# Patient Record
Sex: Male | Born: 1985
Health system: Southern US, Community
[De-identification: ages and names within clinical notes are randomized; demographics above are authoritative.]

## PROBLEM LIST (undated history)

## (undated) DIAGNOSIS — E785 Hyperlipidemia, unspecified: Secondary | ICD-10-CM

## (undated) DIAGNOSIS — I219 Acute myocardial infarction, unspecified: Secondary | ICD-10-CM

## (undated) DIAGNOSIS — I10 Essential (primary) hypertension: Secondary | ICD-10-CM

## (undated) DIAGNOSIS — N049 Nephrotic syndrome with unspecified morphologic changes: Secondary | ICD-10-CM

## (undated) HISTORY — PX: SKIN GRAFT: SHX250

---

## 2010-08-04 ENCOUNTER — Emergency Department (HOSPITAL_COMMUNITY): Admission: EM | Admit: 2010-08-04 | Discharge: 2010-08-04 | Payer: Self-pay | Admitting: Emergency Medicine

## 2010-12-09 ENCOUNTER — Emergency Department (HOSPITAL_COMMUNITY)
Admission: EM | Admit: 2010-12-09 | Discharge: 2010-12-09 | Disposition: A | Discharge status: Home / Self Care | Payer: Self-pay | Attending: Emergency Medicine | Admitting: Emergency Medicine

## 2010-12-09 ENCOUNTER — Emergency Department (HOSPITAL_COMMUNITY): Payer: Self-pay

## 2010-12-09 DIAGNOSIS — M79609 Pain in unspecified limb: Secondary | ICD-10-CM | POA: Insufficient documentation

## 2010-12-09 DIAGNOSIS — L02519 Cutaneous abscess of unspecified hand: Secondary | ICD-10-CM | POA: Insufficient documentation

## 2010-12-09 DIAGNOSIS — L03019 Cellulitis of unspecified finger: Secondary | ICD-10-CM | POA: Insufficient documentation

## 2010-12-09 DIAGNOSIS — R229 Localized swelling, mass and lump, unspecified: Secondary | ICD-10-CM | POA: Insufficient documentation

## 2010-12-09 LAB — URINALYSIS, ROUTINE W REFLEX MICROSCOPIC
Bilirubin Urine: NEGATIVE
Glucose, UA: NEGATIVE mg/dL
Hgb urine dipstick: NEGATIVE
Ketones, ur: NEGATIVE mg/dL
pH: 7 (ref 5.0–8.0)

## 2011-05-28 ENCOUNTER — Emergency Department (HOSPITAL_COMMUNITY)
Admission: EM | Admit: 2011-05-28 | Discharge: 2011-05-28 | Disposition: A | Discharge status: Home / Self Care | Payer: Self-pay | Attending: Emergency Medicine | Admitting: Emergency Medicine

## 2011-05-28 DIAGNOSIS — M7989 Other specified soft tissue disorders: Secondary | ICD-10-CM | POA: Insufficient documentation

## 2011-05-28 DIAGNOSIS — L259 Unspecified contact dermatitis, unspecified cause: Secondary | ICD-10-CM | POA: Insufficient documentation

## 2011-05-28 DIAGNOSIS — M79609 Pain in unspecified limb: Secondary | ICD-10-CM | POA: Insufficient documentation

## 2012-01-08 ENCOUNTER — Encounter (HOSPITAL_COMMUNITY): Payer: Self-pay | Admitting: *Deleted

## 2012-01-08 ENCOUNTER — Emergency Department (HOSPITAL_COMMUNITY)
Admission: EM | Admit: 2012-01-08 | Discharge: 2012-01-08 | Disposition: A | Discharge status: Home / Self Care | Payer: Self-pay | Attending: Emergency Medicine | Admitting: Emergency Medicine

## 2012-01-08 DIAGNOSIS — H9209 Otalgia, unspecified ear: Secondary | ICD-10-CM | POA: Insufficient documentation

## 2012-01-08 DIAGNOSIS — H9201 Otalgia, right ear: Secondary | ICD-10-CM

## 2012-01-08 MED ORDER — AMOXICILLIN 500 MG PO CAPS: 500.0000 mg | ORAL_CAPSULE | Freq: Three times a day (TID) | ORAL | Status: AC

## 2012-01-08 MED ORDER — TRAMADOL HCL 50 MG PO TABS: 50.0000 mg | ORAL_TABLET | Freq: Four times a day (QID) | ORAL | Status: AC | PRN

## 2012-01-08 MED ORDER — IBUPROFEN 800 MG PO TABS: 800.0000 mg | ORAL_TABLET | Freq: Three times a day (TID) | ORAL | Status: AC

## 2012-01-08 NOTE — ED Provider Notes (Signed)
Medical screening examination/treatment/procedure(s) were performed by non-physician practitioner and as supervising physician I was immediately available for consultation/collaboration.  Gregoria Selvy, MD 01/08/12 1556 

## 2012-01-08 NOTE — ED Notes (Signed)
Pt reports right ear pain x 2 days associated with sore throat and right sided facial pain.

## 2012-01-08 NOTE — ED Provider Notes (Signed)
History     CSN: [id number]  Arrival date & time 01/08/12  0801   First MD Initiated Contact with Patient 01/08/12 [phone]      Chief Complaint  Patient presents with  . Otalgia    (Consider location/radiation/quality/duration/timing/severity/associated sxs/prior treatment) HPI History provided by pt.   Pt has had severe, constant, right inner ear pain w/ radiation into right jaw for the past two days.  Aggravated by opening and closing mouth.  Associated w/ mild hearing impairment, nasal congestion and mild cough that started last night.  Pt has no known h/o OM or TMJ.  Denies dental pain.  Denies trauma.    History reviewed. No pertinent past medical history.  History reviewed. No pertinent past surgical history.  History reviewed. No pertinent family history.  History  Substance Use Topics  . Smoking status: Never Smoker   . Smokeless tobacco: Not on file  . Alcohol Use: Yes     occ      Review of Systems  All other systems reviewed and are negative.    Allergies  Review of patient's allergies indicates no known allergies.  Home Medications   Current Outpatient Rx  Name Route Sig Dispense Refill  . IBUPROFEN 800 MG PO TABS Oral Take 1 tablet (800 mg total) by mouth 3 (three) times daily. 12 tablet 0  . TRAMADOL HCL 50 MG PO TABS Oral Take 1 tablet (50 mg total) by mouth every 6 (six) hours as needed for pain. 12 tablet 0    BP 146/81  Pulse 71  Temp(Src) 98.6 F (37 C) (Oral)  Resp 16  SpO2 99%  Physical Exam  Nursing note and vitals reviewed. Constitutional: He is oriented to person, place, and time. He appears well-developed and well-nourished. No distress.  HENT:  Head: Normocephalic and atraumatic. No trismus in the jaw.  Right Ear: Tympanic membrane, external ear and ear canal normal.  Left Ear: Tympanic membrane, external ear and ear canal normal.  Mouth/Throat: Uvula is midline and mucous membranes are normal. No oropharyngeal exudate, posterior  oropharyngeal edema or posterior oropharyngeal erythema.       Left TM nml.  Right TM w/ scarring, poor light reflex and what appears to be purulent fluid behind it.  Pain w/ palpation of tragus.  No tenderness of eustachian tube, mastoid, TMJ or mandible.  No popping or pain w/ opening jaw.  All teeth non-tender and gingiva/buccal mucosa and posterior pharynx appear normal.   Eyes:       Normal appearance  Neck: Normal range of motion. Neck supple.  Cardiovascular: Normal rate and regular rhythm.   Pulmonary/Chest: Effort normal and breath sounds normal.  Lymphadenopathy:    He has no cervical adenopathy.  Neurological: He is alert and oriented to person, place, and time.  Skin: Skin is warm and dry. No rash noted.  Psychiatric: He has a normal mood and affect. His behavior is normal.    ED Course  Procedures (including critical care time)  Labs Reviewed - No data to display No results found.   1. Otalgia of right ear       MDM  Pt presents w/ right ear pain x 2 days.  Exam most consistent w/ OM.  Pt prescribed amoxicillin for delayed abx therapy; discussed w/ him extensively. D/c'd home w/ ibuprofen and tramadol for pain.  Referred to [facility].         Arie Sabina Claira Jeter, [place] 01/08/12 1230

## 2012-01-08 NOTE — Discharge Instructions (Signed)
Your ear pain is most likely related to a viral upper respiratory tract infection.  Otalgia The most common reason for this in children is an infection of the middle ear. Pain from the middle ear is usually caused by a build-up of fluid and pressure behind the eardrum. Pain from an earache can be sharp, dull, or burning. The pain may be temporary or constant. The middle ear is connected to the nasal passages by a short narrow tube called the Eustachian tube. The Eustachian tube allows fluid to drain out of the middle ear, and helps keep the pressure in your ear equalized. CAUSES  A cold or allergy can block the Eustachian tube with inflammation and the build-up of secretions. This is especially likely in small children, because their Eustachian tube is shorter and more horizontal. When the Eustachian tube closes, the normal flow of fluid from the middle ear is stopped. Fluid can accumulate and cause stuffiness, pain, hearing loss, and an ear infection if germs start growing in this area. SYMPTOMS  The symptoms of an ear infection may include fever, ear pain, fussiness, increased crying, and irritability. Many children will have temporary and minor hearing loss during and right after an ear infection. Permanent hearing loss is rare, but the risk increases the more infections a child has. Other causes of ear pain include retained water in the outer ear canal from swimming and bathing. Ear pain in adults is less likely to be from an ear infection. Ear pain may be referred from other locations. Referred pain may be from the joint between your jaw and the skull. It may also come from a tooth problem or problems in the neck. Other causes of ear pain include:  A foreign body in the ear.   Outer ear infection.   Sinus infections.   Impacted ear wax.   Ear injury.   Arthritis of the jaw or TMJ problems.   Middle ear infection.   Tooth infections.   Sore throat with pain to the ears.  DIAGNOSIS  Your  caregiver can usually make the diagnosis by examining you. Sometimes other special studies, including x-rays and lab work may be necessary. TREATMENT   If antibiotics were prescribed, use them as directed and finish them even if you or your child's symptoms seem to be improved.   Sometimes PE tubes are needed in children. These are little plastic tubes which are put into the eardrum during a simple surgical procedure. They allow fluid to drain easier and allow the pressure in the middle ear to equalize. This helps relieve the ear pain caused by pressure changes.  HOME CARE INSTRUCTIONS   Only take over-the-counter or prescription medicines for pain, discomfort, or fever as directed by your caregiver. DO NOT GIVE CHILDREN ASPIRIN because of the association of Reye's Syndrome in children taking aspirin.   Use a cold pack applied to the outer ear for 15 to 20 minutes, 3 to 4 times per day or as needed may reduce pain. Do not apply ice directly to the skin. You may cause frost bite.   Over-the-counter ear drops used as directed may be effective. Your caregiver may sometimes prescribe ear drops.   Resting in an upright position may help reduce pressure in the middle ear and relieve pain.   Ear pain caused by rapidly descending from high altitudes can be relieved by swallowing or chewing gum. Allowing infants to suck on a bottle during airplane travel can help.   Do not smoke in  the house or near children. If you are unable to quit smoking, smoke outside.   Control allergies.  SEEK IMMEDIATE MEDICAL CARE IF:   You or your child are becoming sicker.   Pain or fever relief is not obtained with medicine.   You or your child's symptoms (pain, fever, or irritability) do not improve within 24 to 48 hours or as instructed.   Severe pain suddenly stops hurting. This may indicate a ruptured eardrum.   You or your children develop new problems such as severe headaches, stiff neck, difficulty  swallowing, or swelling of the face or around the ear.  Document Released: 05/01/2004 Document Revised: 09/03/2011 Document Reviewed: 09/05/2008 [facility] Patient Information 2012 [place], [place].Hold onto antibiotic until Sunday morning.  If your pain has not started to improve or worsens by that time, take medication as prescribed.  Take ibuprofen w/ food up to three times a day, as needed for pain.  Take ultram if pain severe.  Do not drive within four hours of taking this medication (may cause drowsiness or confusion).  Call [facility] [phone]) if you do not have a primary care doctor and would like assistance with finding one.    You may return to the ER if symptoms worsen or you have any other concerns.

## 2012-05-18 ENCOUNTER — Encounter (HOSPITAL_COMMUNITY): Payer: Self-pay | Admitting: *Deleted

## 2012-05-18 ENCOUNTER — Emergency Department (HOSPITAL_COMMUNITY)
Admission: EM | Admit: 2012-05-18 | Discharge: 2012-05-19 | Disposition: A | Discharge status: Home / Self Care | Payer: 59 | Attending: Emergency Medicine | Admitting: Emergency Medicine

## 2012-05-18 DIAGNOSIS — S61219A Laceration without foreign body of unspecified finger without damage to nail, initial encounter: Secondary | ICD-10-CM

## 2012-05-18 DIAGNOSIS — S61209A Unspecified open wound of unspecified finger without damage to nail, initial encounter: Secondary | ICD-10-CM | POA: Insufficient documentation

## 2012-05-18 DIAGNOSIS — W278XXA Contact with other nonpowered hand tool, initial encounter: Secondary | ICD-10-CM | POA: Insufficient documentation

## 2012-05-18 NOTE — ED Notes (Signed)
Patient with laceration to right middle finger with grinder on Tuesday morning, patient with bandage on finger at this time, no bleeding at this time

## 2012-05-19 NOTE — ED Provider Notes (Signed)
History     CSN: [id number]  Arrival date & time 05/18/12  2243   First MD Initiated Contact with Patient 05/19/12 0002      Chief Complaint  Patient presents with  . Laceration    (Consider location/radiation/quality/duration/timing/severity/associated sxs/prior treatment) Patient is a 26 y.o. male presenting with skin laceration. The history is provided by the patient.  Laceration  The incident occurred yesterday. Pain location: right middle finger. The laceration is 2 cm in size. Injury mechanism: it was cut by a grinder. The pain is at a severity of 2/10. The pain is moderate. The pain has been constant since onset. He reports no foreign bodies present. His tetanus status is UTD.    History reviewed. No pertinent past medical history.  History reviewed. No pertinent past surgical history.  No family history on file.  History  Substance Use Topics  . Smoking status: Never Smoker   . Smokeless tobacco: Not on file  . Alcohol Use: No     occ      Review of Systems  All other systems reviewed and are negative.    Allergies  Review of patient's allergies indicates no known allergies.  Home Medications  No current outpatient prescriptions on file.  BP 131/92  Pulse 66  Temp 97.6 F (36.4 C) (Oral)  Resp 18  SpO2 96%  Physical Exam  Nursing note and vitals reviewed. Constitutional: He is oriented to person, place, and time. He appears well-developed and well-nourished. No distress.  HENT:  Head: Normocephalic and atraumatic.  Eyes: EOM are normal. Pupils are equal, round, and reactive to light.  Musculoskeletal:       Hands: Neurological: He is alert and oriented to person, place, and time. He has normal strength. No sensory deficit.  Skin: Skin is warm and dry.    ED Course  Procedures (including critical care time)  Labs Reviewed - No data to display No results found.   No diagnosis found.    MDM   Patient with a laceration to his right  middle finger over 24 hours ago which he states continues to bleed. On exam the wound is no longer bleeding normal appearing and not no signs of infection. He has fair range of motion of the finger and normal capillary refill. Patient given bacitracin. His tetanus shot is up-to-date.        Gwyneth Sprout, MD 05/19/12 [phone]

## 2012-10-22 ENCOUNTER — Encounter (HOSPITAL_COMMUNITY): Payer: Self-pay | Admitting: Emergency Medicine

## 2012-10-22 ENCOUNTER — Emergency Department (HOSPITAL_COMMUNITY)
Admission: EM | Admit: 2012-10-22 | Discharge: 2012-10-22 | Disposition: A | Discharge status: Home / Self Care | Payer: 59 | Attending: Emergency Medicine | Admitting: Emergency Medicine

## 2012-10-22 DIAGNOSIS — Y9389 Activity, other specified: Secondary | ICD-10-CM | POA: Insufficient documentation

## 2012-10-22 DIAGNOSIS — Y9289 Other specified places as the place of occurrence of the external cause: Secondary | ICD-10-CM | POA: Insufficient documentation

## 2012-10-22 DIAGNOSIS — M5380 Other specified dorsopathies, site unspecified: Secondary | ICD-10-CM | POA: Insufficient documentation

## 2012-10-22 DIAGNOSIS — X500XXA Overexertion from strenuous movement or load, initial encounter: Secondary | ICD-10-CM | POA: Insufficient documentation

## 2012-10-22 DIAGNOSIS — IMO0002 Reserved for concepts with insufficient information to code with codable children: Secondary | ICD-10-CM | POA: Insufficient documentation

## 2012-10-22 DIAGNOSIS — M549 Dorsalgia, unspecified: Secondary | ICD-10-CM

## 2012-10-22 DIAGNOSIS — M62838 Other muscle spasm: Secondary | ICD-10-CM

## 2012-10-22 DIAGNOSIS — Y990 Civilian activity done for income or pay: Secondary | ICD-10-CM | POA: Insufficient documentation

## 2012-10-22 MED ORDER — NAPROXEN 500 MG PO TABS: 500.0000 mg | ORAL_TABLET | Freq: Two times a day (BID) | ORAL | Status: DC

## 2012-10-22 MED ORDER — DIAZEPAM 5 MG PO TABS: 5.0000 mg | ORAL_TABLET | Freq: Three times a day (TID) | ORAL | Status: DC | PRN

## 2012-10-22 NOTE — ED Notes (Addendum)
Patient complaining of mid-back pain that started on Friday after he moved a heater; patient ambulatory in triage.  Able to move all extremities without difficulty.  Has not taken any OTC medications for pain.

## 2012-10-22 NOTE — ED Notes (Signed)
Pt in nad, pt c/o mid back pain that started yesterday after lifting and moving heavy tools at work. Pt has full movement and sensation of bilateral lower extremities. Pt ambulatory.

## 2012-10-22 NOTE — ED Provider Notes (Signed)
History   This chart was scribed for non-physician practitioner working with Derwood Kaplan, MD by Leone Payor, ED Scribe. This patient was seen in room TR10C/TR10C and the patient's care was started at 1928.   CSN: [id number]  Arrival date & time 10/22/12  1928   First MD Initiated Contact with Patient 10/22/12 1945      Chief Complaint  Patient presents with  . Back Pain     The history is provided by the patient. No language interpreter was used.    Justin Valdez is a 27 y.o. male who presents to the Emergency Department complaining of constant, unchanged, non-radiating mid back pain starting 1 day ago after lifting and moving heavy tools at work. Pt denies numbness, weakness, tingling, fever, night sweats, chills,    Pt denies smoking but occasionally uses alcohol.  History reviewed. No pertinent past medical history.  Past Surgical History  Procedure Date  . Skin graft     History reviewed. No pertinent family history.  History  Substance Use Topics  . Smoking status: Never Smoker   . Smokeless tobacco: Not on file  . Alcohol Use: Yes     Comment: Occassional Use      Review of Systems  A complete 10 system review of systems was obtained and all systems are negative except as noted in the HPI and PMH.    Allergies  Review of patient's allergies indicates no known allergies.  Home Medications  No current outpatient prescriptions on file.  BP 169/95  Temp 97.7 F (36.5 C) (Oral)  Resp 18  SpO2 97%  Physical Exam  Nursing note and vitals reviewed. Constitutional: He is oriented to person, place, and time. He appears well-developed and well-nourished. No distress.  HENT:  Head: Normocephalic and atraumatic.  Eyes: Conjunctivae normal and EOM are normal.  Neck: Normal range of motion. Neck supple.  Cardiovascular:       Intact distal pulses, capillary refill < 3 seconds.   Pulmonary/Chest: Effort normal.  Musculoskeletal: Normal range of motion.       All other extremities with normal ROM  Para thoracic tenderness. Muscular tenderness.  No spinal process tenderness. Pain with rotation, flexion, extension. Lower extremities with normal ROM.     Neurological: He is alert and oriented to person, place, and time.       No sensory deficit.   Strength 5/5 bilaterally in lower extremities, including thighs and plantar flexion. Proximal and distal sensation intact.    Skin: Skin is warm and dry. No rash noted. He is not diaphoretic.       Skin intact, no tenting  Psychiatric: He has a normal mood and affect. His behavior is normal.    ED Course  Procedures (including critical care time)  DIAGNOSTIC STUDIES: Oxygen Saturation is 97% on room air, adequate by my interpretation.    COORDINATION OF CARE:  8:09 PM Discussed treatment plan which includes Valium, use of heating pad with pt at bedside and pt agreed to plan. Pt advised not to lift heavy objects over 10 lbs and continued use of OTC pain medication. Pt will be given work note.   Labs Reviewed - No data to display No results found.   No diagnosis found.    MDM  Back pain  Patient with back pain.  No neurological deficits and normal neuro exam.  Patient can walk but states is painful.  No loss of bowel or bladder control.  No concern for cauda equina.  No fever, night sweats, weight loss, h/o cancer, IVDU.  RICE protocol and pain medicine indicated and discussed with patient.        I personally performed the services described in this documentation, which was scribed in my presence. The recorded information has been reviewed and is accurate.      Jaci Carrel, [place] 10/22/12 2344

## 2012-10-23 NOTE — ED Provider Notes (Signed)
Medical screening examination/treatment/procedure(s) were performed by non-physician practitioner and as supervising physician I was immediately available for consultation/collaboration.  Rickelle Sylvestre, MD 10/23/12 1600 

## 2013-08-28 ENCOUNTER — Emergency Department (HOSPITAL_COMMUNITY)
Admission: EM | Admit: 2013-08-28 | Discharge: 2013-08-28 | Disposition: A | Discharge status: Home / Self Care | Payer: 59 | Attending: Emergency Medicine | Admitting: Emergency Medicine

## 2013-08-28 ENCOUNTER — Encounter (HOSPITAL_COMMUNITY): Payer: Self-pay | Admitting: Emergency Medicine

## 2013-08-28 DIAGNOSIS — H9201 Otalgia, right ear: Secondary | ICD-10-CM

## 2013-08-28 DIAGNOSIS — H6691 Otitis media, unspecified, right ear: Secondary | ICD-10-CM

## 2013-08-28 DIAGNOSIS — H6690 Otitis media, unspecified, unspecified ear: Secondary | ICD-10-CM | POA: Insufficient documentation

## 2013-08-28 MED ORDER — ANTIPYRINE-BENZOCAINE 5.4-1.4 % OT SOLN
3.0000 [drp] | Freq: Once | OTIC | Status: AC
  Administered 2013-08-28: 4 [drp] via OTIC
  Filled 2013-08-28: qty 10

## 2013-08-28 MED ORDER — AMOXICILLIN 500 MG PO CAPS: 500.0000 mg | ORAL_CAPSULE | Freq: Three times a day (TID) | ORAL | Status: DC

## 2013-08-28 MED ORDER — IBUPROFEN 200 MG PO TABS
600.0000 mg | ORAL_TABLET | Freq: Once | ORAL | Status: AC
  Administered 2013-08-28: 600 mg via ORAL
  Filled 2013-08-28: qty 3

## 2013-08-28 NOTE — ED Notes (Signed)
Pt states he sneezed and felt like right ear popped.  Pt states pain radiate to the entire right side of face. Pain level at 10 at present. No meds taken prior to arrival.

## 2013-08-28 NOTE — ED Provider Notes (Signed)
CSN: [id number]     Arrival date & time 08/28/13  4098 History   First MD Initiated Contact with Patient 08/28/13 [phone]     Chief Complaint  Patient presents with  . Otalgia   (Consider location/radiation/quality/duration/timing/severity/associated sxs/prior Treatment) The history is provided by the patient.   patient reports worsening right ear pain over the past 24 hours.  No trauma to this area.  He denies fevers and chills.  No upper respiratory symptoms.  No history of ear infections.  No recent swimming.  His pain is mild to moderate in severity.  Nothing worsens or improves his pain  History reviewed. No pertinent past medical history. Past Surgical History  Procedure Laterality Date  . Skin graft     No family history on file. History  Substance Use Topics  . Smoking status: Never Smoker   . Smokeless tobacco: Not on file  . Alcohol Use: Yes     Comment: Occassional Use    Review of Systems  HENT: Positive for ear pain.   All other systems reviewed and are negative.    Allergies  Review of patient's allergies indicates no known allergies.  Home Medications   Current Outpatient Rx  Name  Route  Sig  Dispense  Refill  . amoxicillin (AMOXIL) 500 MG capsule   Oral   Take 1 capsule (500 mg total) by mouth 3 (three) times daily.   21 capsule   0    BP 166/76  Pulse 67  Temp(Src) 97.7 F (36.5 C) (Oral)  Resp 16  SpO2 97% Physical Exam  Nursing note and vitals reviewed. Constitutional: He is oriented to person, place, and time. He appears well-developed and well-nourished.  HENT:  Head: Normocephalic and atraumatic.  Right TM with erythema.  No swelling of his right external canal.  Left TM is normal in appearance.  No drainage from the right TM.  Posterior pharynx normal  Eyes: EOM are normal.  Neck: Normal range of motion.  Cardiovascular: Normal rate.   Pulmonary/Chest: Effort normal.  Abdominal: Soft.  Musculoskeletal: Normal range of motion.   Neurological: He is alert and oriented to person, place, and time.  Skin: Skin is warm and dry.  Psychiatric: He has a normal mood and affect. Judgment normal.    ED Course  Procedures (including critical care time) Labs Review Labs Reviewed - No data to display Imaging Review No results found.  EKG Interpretation   None       MDM   1. Otalgia of right ear   2. Otitis media, right        Lyanne Co, MD 08/28/13 [phone]

## 2013-08-28 NOTE — ED Notes (Signed)
Pt. reports right ear ache onset yesterday with no drainage .

## 2013-11-10 ENCOUNTER — Emergency Department (HOSPITAL_COMMUNITY): Payer: 59

## 2013-11-10 ENCOUNTER — Encounter (HOSPITAL_COMMUNITY): Payer: Self-pay | Admitting: Emergency Medicine

## 2013-11-10 ENCOUNTER — Emergency Department (HOSPITAL_COMMUNITY)
Admission: EM | Admit: 2013-11-10 | Discharge: 2013-11-10 | Disposition: A | Discharge status: Home / Self Care | Payer: 59 | Attending: Emergency Medicine | Admitting: Emergency Medicine

## 2013-11-10 DIAGNOSIS — S46912A Strain of unspecified muscle, fascia and tendon at shoulder and upper arm level, left arm, initial encounter: Secondary | ICD-10-CM

## 2013-11-10 DIAGNOSIS — X500XXA Overexertion from strenuous movement or load, initial encounter: Secondary | ICD-10-CM | POA: Insufficient documentation

## 2013-11-10 DIAGNOSIS — Y929 Unspecified place or not applicable: Secondary | ICD-10-CM | POA: Insufficient documentation

## 2013-11-10 DIAGNOSIS — Y9389 Activity, other specified: Secondary | ICD-10-CM | POA: Insufficient documentation

## 2013-11-10 DIAGNOSIS — IMO0002 Reserved for concepts with insufficient information to code with codable children: Secondary | ICD-10-CM | POA: Insufficient documentation

## 2013-11-10 MED ORDER — TRAMADOL HCL 50 MG PO TABS: 50.0000 mg | ORAL_TABLET | Freq: Four times a day (QID) | ORAL | Status: DC | PRN

## 2013-11-10 MED ORDER — IBUPROFEN 800 MG PO TABS
800.0000 mg | ORAL_TABLET | Freq: Once | ORAL | Status: AC
  Administered 2013-11-10: 800 mg via ORAL
  Filled 2013-11-10: qty 1

## 2013-11-10 MED ORDER — IBUPROFEN 800 MG PO TABS: 800.0000 mg | ORAL_TABLET | Freq: Three times a day (TID) | ORAL | Status: DC

## 2013-11-10 NOTE — ED Provider Notes (Signed)
Medical screening examination/treatment/procedure(s) were conducted as a shared visit with non-physician practitioner(s) or resident  and myself.  I personally evaluated the patient during the encounter and agree with the findings and plan unless otherwise indicated.    I have personally reviewed any xrays and/ or EKG's with the provider and I agree with interpretation.   Left shoulder pain since lifting weights, worse with movement, no fevers.  Exam anterior deltoid tenderness worse with internal rotation and flexion, neg empty can test, no instability.  No warmth or swelling. NV intact left distal arm. Fup with ortho.  Left shoulder pain  Justin SkeensJoshua M Mateen Franssen, MD 11/10/13 [phone]

## 2013-11-10 NOTE — ED Provider Notes (Signed)
CSN: [id number]     Arrival date & time 11/10/13  0814 History  This chart was scribed for non-physician practitioner Jaynie Crumbleatyana Rasean Joos, PA-C working with Enid SkeensJoshua M Zavitz, MD by Dorothey Basemania Sutton, ED Scribe. This patient was seen in room D36C/D36C and the patient's care was started at 9:23 AM.    Chief Complaint  Patient presents with  . Shoulder Pain   The history is provided by the patient. No language interpreter was used.   HPI Comments: Justin Valdez is a 28 y.o. male who presents to the Emergency Department complaining of a constant, sudden onset pain to the left shoulder onset 3 days ago after lifting weights (bench press). He states that the pain is exacerbated with movement. Patient reports applying heat and ice to the area with mild, temporary relief. He denies taking medications at home to treat his symptoms. Patient denies history of prior injury to the area. Patient has no other pertinent medical history. He states that he does not currently have a PCP.  History reviewed. No pertinent past medical history. Past Surgical History  Procedure Laterality Date  . Skin graft    . Skin graft     No family history on file. History  Substance Use Topics  . Smoking status: Never Smoker   . Smokeless tobacco: Not on file  . Alcohol Use: Yes     Comment: Occassional Use    Review of Systems  Musculoskeletal: Positive for arthralgias. Negative for joint swelling.   Allergies  Review of patient's allergies indicates no known allergies.  Home Medications  No current outpatient prescriptions on file.  Triage Vitals: BP 177/33  Pulse 63  Temp(Src) 97.6 F (36.4 C) (Oral)  Resp 18  Ht 5\' 7"  (1.702 m)  Wt 214 lb (97.07 kg)  BMI 33.51 kg/m2  SpO2 96%  Physical Exam  Nursing note and vitals reviewed. Constitutional: He is oriented to person, place, and time. He appears well-developed and well-nourished. No distress.  HENT:  Head: Normocephalic and atraumatic.  Eyes:  Conjunctivae are normal.  Neck: Normal range of motion. Neck supple.  Pulmonary/Chest: Effort normal. No respiratory distress.  Abdominal: He exhibits no distension.  Musculoskeletal: Normal range of motion.  Normal appearing left shoulder, no signs of infection. Shoulder is nontender with palpation. Full passive range of motion with very mild pain except for with external and internal rotation. Patient unable to actively lift his left shoulder greater than 90. Pain is worse with abduction, internal and external rotation. Normal elbow. Distal radial pulses intact. Positive arm drop and active compression test  Neurological: He is alert and oriented to person, place, and time.  Skin: Skin is warm and dry.  Psychiatric: He has a normal mood and affect. His behavior is normal.    ED Course  Procedures (including critical care time)  DIAGNOSTIC STUDIES: Oxygen Saturation is 96% on room air, normal by my interpretation.    COORDINATION OF CARE: 9:27 AM- Discussed that x-ray results were negative and symptoms are likely muscular in nature. Advised patient to rest the area for at least 2 weeks or until symptoms completely subside to prevent further injury. Advised patient to continue alternating heat and ice to the area at home. Will discharge patient with a sling and medication to manage symptoms. Advised patient to follow up with the referred orthopedist if symptoms do not improve. Discussed treatment plan with patient at bedside and patient verbalized agreement.    Labs Review Labs Reviewed - No data to  display  Imaging Review Dg Shoulder Left  11/10/2013   CLINICAL DATA:  Pain post trauma  EXAM: LEFT SHOULDER - 2+ VIEW  COMPARISON:  None.  FINDINGS: Frontal, axillary, and Y scapular images were obtained. There is no fracture or dislocation. Joint spaces appear intact. No erosive change or intra-articular calcification.  IMPRESSION: No abnormality noted.   Electronically Signed   By: Bretta Bang M.D.   On: 11/10/2013 09:23    EKG Interpretation   None       MDM   Final diagnoses:  Strain of shoulder, left     Patient's with left shoulder injury while doing bench press. Exam suspicious for possible rotator cuff injury versus a SLAP tear. At this time will treat with rest, ice, NSAIDs. Advised to not lift anything a male 1 pound for 1-2 weeks. If pain is not improving followup with orthopedic specialist for further evaluation. He is neurovascularly intact.    I personally performed the services described in this documentation, which was scribed in my presence. The recorded information has been reviewed and is accurate.   Lottie Mussel, PA-C 11/10/13 [phone]

## 2013-11-10 NOTE — ED Notes (Signed)
Dr. Zavitz at the bedside.  

## 2013-11-10 NOTE — ED Notes (Signed)
Pt undressed, in gown, on continuous pulse oximetry and blood pressure cuff 

## 2013-11-10 NOTE — ED Notes (Signed)
Pt states he was lifting weights on Wednesday and has been having pain to his left shoulder since. States he is able to lift his arm out/up but not across his chest. States the pain is a 10/10 with movement.

## 2013-11-10 NOTE — Discharge Instructions (Signed)
Ice your shoulder several times a day. No lifting anything heavier than 1 lb. Sling as needed. Ibuprofen for pain. Ultram for severe pain only. Follow up with orthopedics specialist if you are not improving.   Shoulder Pain The shoulder is the joint that connects your arms to your body. The bones that form the shoulder joint include the upper arm bone (humerus), the shoulder blade (scapula), and the collarbone (clavicle). The top of the humerus is shaped like a ball and fits into a rather flat socket on the scapula (glenoid cavity). A combination of muscles and strong, fibrous tissues that connect muscles to bones (tendons) support your shoulder joint and hold the ball in the socket. Small, fluid-filled sacs (bursae) are located in different areas of the joint. They act as cushions between the bones and the overlying soft tissues and help reduce friction between the gliding tendons and the bone as you move your arm. Your shoulder joint allows a wide range of motion in your arm. This range of motion allows you to do things like scratch your back or throw a ball. However, this range of motion also makes your shoulder more prone to pain from overuse and injury. Causes of shoulder pain can originate from both injury and overuse and usually can be grouped in the following four categories:  Redness, swelling, and pain (inflammation) of the tendon (tendinitis) or the bursae (bursitis).  Instability, such as a dislocation of the joint.  Inflammation of the joint (arthritis).  Broken bone (fracture). HOME CARE INSTRUCTIONS   Apply ice to the sore area.  Put ice in a plastic bag.  Place a towel between your skin and the bag.  Leave the ice on for 15-20 minutes, 03-04 times per day for the first 2 days.  Stop using cold packs if they do not help with the pain.  If you have a shoulder sling or immobilizer, wear it as long as your caregiver instructs. Only remove it to shower or bathe. Move your arm as  little as possible, but keep your hand moving to prevent swelling.  Squeeze a soft ball or foam pad as much as possible to help prevent swelling.  Only take over-the-counter or prescription medicines for pain, discomfort, or fever as directed by your caregiver. SEEK MEDICAL CARE IF:   Your shoulder pain increases, or new pain develops in your arm, hand, or fingers.  Your hand or fingers become cold and numb.  Your pain is not relieved with medicines. SEEK IMMEDIATE MEDICAL CARE IF:   Your arm, hand, or fingers are numb or tingling.  Your arm, hand, or fingers are significantly swollen or turn white or blue. MAKE SURE YOU:   Understand these instructions.  Will watch your condition.  Will get help right away if you are not doing well or get worse. Document Released: 06/24/2005 Document Revised: 06/08/2012 Document Reviewed: 08/29/2011 [facility] LPExitCare Patient Information 2014 [place]ExitCare, [place].

## 2014-04-03 ENCOUNTER — Emergency Department: Payer: Self-pay | Admitting: Emergency Medicine

## 2014-04-03 LAB — GC/CHLAMYDIA PROBE AMP

## 2014-12-12 ENCOUNTER — Encounter (HOSPITAL_COMMUNITY): Payer: Self-pay | Admitting: Emergency Medicine

## 2014-12-12 ENCOUNTER — Emergency Department (HOSPITAL_COMMUNITY)
Admission: EM | Admit: 2014-12-12 | Discharge: 2014-12-12 | Disposition: A | Discharge status: Home / Self Care | Payer: BLUE CROSS/BLUE SHIELD | Attending: Emergency Medicine | Admitting: Emergency Medicine

## 2014-12-12 DIAGNOSIS — R05 Cough: Secondary | ICD-10-CM | POA: Diagnosis not present

## 2014-12-12 DIAGNOSIS — H6591 Unspecified nonsuppurative otitis media, right ear: Secondary | ICD-10-CM

## 2014-12-12 DIAGNOSIS — J3489 Other specified disorders of nose and nasal sinuses: Secondary | ICD-10-CM | POA: Diagnosis not present

## 2014-12-12 DIAGNOSIS — R42 Dizziness and giddiness: Secondary | ICD-10-CM | POA: Insufficient documentation

## 2014-12-12 DIAGNOSIS — R51 Headache: Secondary | ICD-10-CM | POA: Diagnosis not present

## 2014-12-12 DIAGNOSIS — R0981 Nasal congestion: Secondary | ICD-10-CM | POA: Insufficient documentation

## 2014-12-12 DIAGNOSIS — H9201 Otalgia, right ear: Secondary | ICD-10-CM | POA: Diagnosis present

## 2014-12-12 MED ORDER — AMOXICILLIN-POT CLAVULANATE 875-125 MG PO TABS: 1.0000 | ORAL_TABLET | Freq: Two times a day (BID) | ORAL | Status: DC

## 2014-12-12 MED ORDER — HYDROCODONE-ACETAMINOPHEN 5-325 MG PO TABS: 2.0000 | ORAL_TABLET | ORAL | Status: DC | PRN

## 2014-12-12 MED ORDER — IBUPROFEN 800 MG PO TABS: 800.0000 mg | ORAL_TABLET | Freq: Three times a day (TID) | ORAL | Status: DC

## 2014-12-12 MED ORDER — HYDROCODONE-ACETAMINOPHEN 5-325 MG PO TABS
2.0000 | ORAL_TABLET | Freq: Once | ORAL | Status: AC
  Administered 2014-12-12: 2 via ORAL
  Filled 2014-12-12: qty 2

## 2014-12-12 MED ORDER — AMOXICILLIN-POT CLAVULANATE 875-125 MG PO TABS
1.0000 | ORAL_TABLET | Freq: Once | ORAL | Status: AC
  Administered 2014-12-12: 1 via ORAL
  Filled 2014-12-12: qty 1

## 2014-12-12 MED ORDER — IBUPROFEN 800 MG PO TABS
800.0000 mg | ORAL_TABLET | Freq: Once | ORAL | Status: AC
  Administered 2014-12-12: 800 mg via ORAL
  Filled 2014-12-12: qty 1

## 2014-12-12 MED ORDER — ANTIPYRINE-BENZOCAINE 5.4-1.4 % OT SOLN: 3.0000 [drp] | OTIC | Status: DC | PRN

## 2014-12-12 NOTE — ED Provider Notes (Signed)
CSN: [id number]     Arrival date & time 12/12/14  0148 History  This chart was scribed for Justin Severin, MD by Annye Asa, ED Scribe. This patient was seen in room A06C/A06C and the patient's care was started at 4:39 AM.    Chief Complaint  Patient presents with  . Otalgia   Patient is a 29 y.o. male presenting with ear pain. The history is provided by the patient. No language interpreter was used.  Otalgia Associated symptoms: congestion, cough, headaches and rhinorrhea      HPI Comments: Justin Valdez is a 29 y.o. male who presents to the Emergency Department complaining of 1 day of right-sided otalgia with associated right-sided facial pain, headache and dizziness. His pain is worse when lying down; it did not improve with rest.  He notes recent cough and cold symptoms, including cough, cold, and congestion. He denies any dental problems at this time. No treatments or medications tried PTA.  History reviewed. No pertinent past medical history. Past Surgical History  Procedure Laterality Date  . Skin graft    . Skin graft     No family history on file. History  Substance Use Topics  . Smoking status: Never Smoker   . Smokeless tobacco: Not on file  . Alcohol Use: Yes     Comment: Occassional Use    Review of Systems  HENT: Positive for congestion, ear pain and rhinorrhea.   Respiratory: Positive for cough.   Neurological: Positive for dizziness and headaches.  All other systems reviewed and are negative.   Allergies  Review of patient's allergies indicates no known allergies.  Home Medications   Prior to Admission medications   Medication Sig Start Date End Date Taking? Authorizing Provider  ibuprofen (ADVIL,MOTRIN) 800 MG tablet Take 1 tablet (800 mg total) by mouth 3 (three) times daily. Patient not taking: Reported on 12/12/2014 11/10/13   Tatyana Kirichenko, PA-C  traMADol (ULTRAM) 50 MG tablet Take 1 tablet (50 mg total) by mouth every 6 (six) hours as  needed. Patient not taking: Reported on 12/12/2014 11/10/13   Tatyana Kirichenko, PA-C   BP 159/74 mmHg  Pulse 93  Temp(Src) 98.2 F (36.8 C) (Oral)  Resp 18  Ht  (1.702 m)  Wt 219 lb (99.338 kg)  BMI 34.29 kg/m2  SpO2 97% Physical Exam  Constitutional: He is oriented to person, place, and time. He appears well-developed and well-nourished. He appears distressed.  HENT:  Head: Normocephalic and atraumatic.  Left Ear: External ear normal.  Mouth/Throat: Oropharynx is clear and moist. No oropharyngeal exudate.  Moist mucous membranes. Right TM bulging and erythematous.   Eyes: EOM are normal. Pupils are equal, round, and reactive to light.  Neck: Normal range of motion. Neck supple. No JVD present.  Cardiovascular: Normal rate, regular rhythm, normal heart sounds and intact distal pulses.  Exam reveals no gallop and no friction rub.   No murmur heard. Pulmonary/Chest: Effort normal and breath sounds normal. No respiratory distress. He has no wheezes. He has no rales.  Abdominal: Soft. Bowel sounds are normal. He exhibits no mass. There is no tenderness. There is no rebound and no guarding.  Musculoskeletal: Normal range of motion. He exhibits no edema.  Moves all extremities normally.   Lymphadenopathy:    He has no cervical adenopathy.  Neurological: He is alert and oriented to person, place, and time. He displays normal reflexes.  Skin: Skin is warm and dry. No rash noted. No erythema. No pallor.  Psychiatric: He has a normal mood and affect. His behavior is normal.  Nursing note and vitals reviewed.   ED Course  Procedures   DIAGNOSTIC STUDIES: Oxygen Saturation is 97% on RA, adequate by my interpretation.    COORDINATION OF CARE: Patient will not be driving himself home tonight.    4:45 AM Discussed treatment plan with pt at bedside and pt agreed to plan.   Labs Review Labs Reviewed - No data to display  Imaging Review No results found.   EKG  Interpretation None      MDM   Final diagnoses:  Right otitis media with effusion   29 year old male with onset of right ear pain today that has been severe.  Recent head cold.  Patient appears to have a right otitis media with effusion and bulging of the eardrum.  Patient be started on Augmentin, Vicodin for pain.  I have given a prescription for auralgan, although I'm not sure he'll be able to fill it as FDA has recently taken off the market.  I personally performed the services described in this documentation, which was scribed in my presence. The recorded information has been reviewed and is accurate.     Justin Severinlga Vibhav Waddill, MD 12/12/14 [id number]

## 2014-12-12 NOTE — Discharge Instructions (Signed)

## 2014-12-12 NOTE — ED Notes (Signed)
Pt reports R ear pain onset today. Denies drainage, injury. Pt also having headache.

## 2015-06-14 ENCOUNTER — Encounter (HOSPITAL_COMMUNITY): Payer: Self-pay | Admitting: Emergency Medicine

## 2015-06-14 ENCOUNTER — Emergency Department (HOSPITAL_COMMUNITY)
Admission: EM | Admit: 2015-06-14 | Discharge: 2015-06-14 | Disposition: A | Discharge status: Home / Self Care | Payer: BLUE CROSS/BLUE SHIELD | Attending: Emergency Medicine | Admitting: Emergency Medicine

## 2015-06-14 ENCOUNTER — Emergency Department (HOSPITAL_COMMUNITY): Payer: BLUE CROSS/BLUE SHIELD

## 2015-06-14 DIAGNOSIS — Z792 Long term (current) use of antibiotics: Secondary | ICD-10-CM | POA: Diagnosis not present

## 2015-06-14 DIAGNOSIS — Y998 Other external cause status: Secondary | ICD-10-CM | POA: Diagnosis not present

## 2015-06-14 DIAGNOSIS — Y9389 Activity, other specified: Secondary | ICD-10-CM | POA: Diagnosis not present

## 2015-06-14 DIAGNOSIS — S99921A Unspecified injury of right foot, initial encounter: Secondary | ICD-10-CM | POA: Insufficient documentation

## 2015-06-14 DIAGNOSIS — Y9289 Other specified places as the place of occurrence of the external cause: Secondary | ICD-10-CM | POA: Diagnosis not present

## 2015-06-14 DIAGNOSIS — X58XXXA Exposure to other specified factors, initial encounter: Secondary | ICD-10-CM | POA: Insufficient documentation

## 2015-06-14 MED ORDER — IBUPROFEN 400 MG PO TABS
800.0000 mg | ORAL_TABLET | Freq: Once | ORAL | Status: AC
  Administered 2015-06-14: 800 mg via ORAL
  Filled 2015-06-14: qty 2

## 2015-06-14 MED ORDER — IBUPROFEN 800 MG PO TABS: 800.0000 mg | ORAL_TABLET | Freq: Three times a day (TID) | ORAL | Status: DC | PRN

## 2015-06-14 NOTE — ED Notes (Signed)
Ice pack applied to Rt foot.

## 2015-06-14 NOTE — ED Notes (Signed)
Pt reports he slipping coming down stairs and now has pain to lateral interior R foot.

## 2015-06-14 NOTE — ED Notes (Signed)
Ace wrap applied to Rt foot

## 2015-06-14 NOTE — ED Notes (Signed)
Rt shoe has been taken off and exposed for provider. Vitals taken. Provider at bedside.

## 2015-06-14 NOTE — ED Provider Notes (Signed)
CSN: [id number]     Arrival date & time 06/14/15  1431 History  This chart was scribed for non-physician practitioner Fayrene Helper, PA-C working with Laurence Spates, MD by Lyndel Safe, ED Scribe. This patient was seen in room TR05C/TR05C and the patient's care was started at 3:12 PM.   Chief Complaint  Patient presents with  . Foot Pain   The history is provided by the patient. No language interpreter was used.   HPI Comments: Justin Valdez is a 29 y.o. male who presents to the Emergency Department complaining of sudden onset, constant, throbbing/aching pain in right lateral foot that is a 2/10 without weight bearing and a 10/10 with weight bearing onset 1 day ago s/p injury. Pt reports onset of pain after he slipped while walking down several steps at a time yesterday and landed on his right foot. His pain is exacerbated with weight bearing. Denies wound or swelling to affected area.   History reviewed. No pertinent past medical history. Past Surgical History  Procedure Laterality Date  . Skin graft    . Skin graft     No family history on file. Social History  Substance Use Topics  . Smoking status: Never Smoker   . Smokeless tobacco: None  . Alcohol Use: Yes     Comment: Occassional Use    Review of Systems  Musculoskeletal: Positive for arthralgias ( right foot). Negative for joint swelling.  Skin: Negative for wound.   Allergies  Review of patient's allergies indicates no known allergies.  Home Medications   Prior to Admission medications   Medication Sig Start Date End Date Taking? Authorizing Provider  amoxicillin-clavulanate (AUGMENTIN) 875-125 MG per tablet Take 1 tablet by mouth 2 (two) times daily. 12/12/14   Marisa Severin, MD  antipyrine-benzocaine Lyla Son) otic solution Place 3-4 drops into the right ear every 2 (two) hours as needed for ear pain. 12/12/14   Marisa Severin, MD  HYDROcodone-acetaminophen (NORCO/VICODIN) 5-325 MG per tablet Take 2 tablets  by mouth every 4 (four) hours as needed. 12/12/14   Marisa Severin, MD  ibuprofen (ADVIL,MOTRIN) 800 MG tablet Take 1 tablet (800 mg total) by mouth 3 (three) times daily. 12/12/14   Marisa Severin, MD   BP 140/85 mmHg  Pulse 56  Temp(Src) 98 F (36.7 C) (Oral)  Resp 18  SpO2 99% Physical Exam  Constitutional: He appears well-developed and well-nourished. No distress.  HENT:  Head: Normocephalic.  Eyes: Conjunctivae are normal.  Neck: No JVD present.  Pulmonary/Chest: Effort normal. No respiratory distress.  Musculoskeletal: Normal range of motion. He exhibits tenderness.  Right foot; point tenderness to sole of foot at mid-foot on palpation; no overlying skin changes, no foreign objects, no bruising or deformity; pedal pulses palpable; sensation intact; NVI; normal dorsiflexion and plantar flexion; pain with foot inversion; right ankle normal.   Neurological: He is alert. Coordination normal.  Skin: Skin is warm. No rash noted. No erythema. No pallor.  Psychiatric: He has a normal mood and affect. His behavior is normal.  Nursing note and vitals reviewed.   ED Course  Procedures  DIAGNOSTIC STUDIES: Oxygen Saturation is 99% on RA, normal by my interpretation.    COORDINATION OF CARE: 3:17 PM Discussed treatment plan which includes to order Xray of right foot with pt. Pt acknowledges and agrees to plan.   3:55 PM Xray neg, rice therapy discussed.  Ace wrap applies.   Imaging Review Dg Foot Complete Right  06/14/2015   CLINICAL DATA:  Twisted  foot while walking down steps  EXAM: RIGHT FOOT COMPLETE - 3+ VIEW  COMPARISON:  None.  FINDINGS: Frontal, oblique, and lateral views obtained. There is no demonstrable fracture or dislocation. Joint spaces appear intact. No erosive change.  IMPRESSION: No fracture or dislocation.  No appreciable arthropathy.   Electronically Signed   By: Bretta Bang III M.D.   On: 06/14/2015 15:44   I have personally reviewed and evaluated these images as part  of my medical decision-making.   MDM   Final diagnoses:  Right foot injury, initial encounter    BP 140/85 mmHg  Pulse 56  Temp(Src) 98 F (36.7 C) (Oral)  Resp 18  SpO2 99%  I have reviewed nursing notes and vital signs. I personally viewed the imaging tests through PACS system and agrees with radiologist's intepretation I reviewed available ER/hospitalization records through the EMR   I personally performed the services described in this documentation, which was scribed in my presence. The recorded information has been reviewed and is accurate.     Fayrene Helper, PA-C 06/14/15 1555  Laurence Spates, MD 06/14/15 [phone]

## 2015-06-14 NOTE — Discharge Instructions (Signed)
Elastic Bandage and RICE °Elastic bandages come in different shapes and sizes. They perform different functions. Your caregiver will help you to decide what is best for your protection, recovery, or rehabilitation following an injury. The following are some general tips to help you use an elastic bandage. °· Use the bandage as directed by the maker of the bandage you are using. °· Do not wrap it too tight. This may cut off the circulation of the arm or leg below the bandage. °· If part of your body beyond the bandage becomes blue, numb, or swollen, it is too tight. Loosen the bandage as needed to prevent these problems. °· See your caregiver or trainer if the bandage seems to be making your problems worse rather than better. °Bandages may be a reminder to you that you have an injury. However, they provide very little support. The few pounds of support they provide are minor considering the pressure it takes to injure a joint or tear ligaments. Therefore, the joint will not be able to handle all of the wear and tear it could before the injury. °The routine care of many injuries includes Rest, Ice, Compression, and Elevation (RICE). °· Rest is required to allow your body to heal. Generally, routine activities can be resumed when comfortable. Injured tendons and bones take about 6 weeks to heal. °· Icing the injury helps keep the swelling down and reduces pain. Do not apply ice directly to the skin. Put ice in a plastic bag. Place a towel between the skin and the bag. This will prevent frostbite to the skin. Apply ice bags to the injured area for 15-20 minutes, every 2 hours while awake. Do this for the first 24 to 48 hours, then as directed by your caregiver. °· Compression helps keep swelling down, gives support, and helps with discomfort. If an elastic bandage has been applied today, it should be removed and reapplied every 3 to 4 hours. It should not be applied tightly, but firmly enough to keep swelling down.  Watch fingers or toes for swelling, bluish discoloration, coldness, numbness, or increased pain. If any of these problems occur, remove the bandage and reapply it more loosely. If these problems persist, contact your caregiver. °· Elevation helps reduce swelling and decreases pain. The injured area (arms, hands, legs, or feet) should be placed near to or above the heart (center of the chest) if able. °Persistent pain and inability to use the injured area for more than 2 to 3 days are warning signs. You should see a caregiver for a follow-up visit as soon as possible. Initially, a minor broken bone (hairline fracture) may not be seen on X-rays. It may take 7 to 10 days to finally show up. Continued pain and swelling show that further evaluation and/or X-rays are needed. Make a follow-up visit with your caregiver. A specialist in reading X-rays (radiologist) will read your X-rays again. °Finding out the results of your test °Not all test results are available during your visit. If your test results are not back during the visit, make an appointment with your caregiver to find out the results. Do not assume everything is normal if you have not heard from your caregiver or the medical facility. It is important for you to follow up on all of your test results. °Document Released: 03/06/2002 Document Revised: 12/07/2011 Document Reviewed: 01/16/2008 °ExitCare® Patient Information ©2015 [employer]. This information is not intended to replace advice given to you by your health care provider. Make sure   you discuss any questions you have with your health care provider. ° °

## 2015-09-08 ENCOUNTER — Encounter (HOSPITAL_COMMUNITY): Payer: Self-pay

## 2015-09-08 ENCOUNTER — Emergency Department (HOSPITAL_COMMUNITY)
Admission: EM | Admit: 2015-09-08 | Discharge: 2015-09-08 | Disposition: A | Discharge status: Home / Self Care | Payer: BLUE CROSS/BLUE SHIELD | Attending: Emergency Medicine | Admitting: Emergency Medicine

## 2015-09-08 DIAGNOSIS — R2243 Localized swelling, mass and lump, lower limb, bilateral: Secondary | ICD-10-CM | POA: Diagnosis present

## 2015-09-08 DIAGNOSIS — R600 Localized edema: Secondary | ICD-10-CM | POA: Diagnosis not present

## 2015-09-08 DIAGNOSIS — Z792 Long term (current) use of antibiotics: Secondary | ICD-10-CM | POA: Diagnosis not present

## 2015-09-08 LAB — I-STAT CHEM 8, ED
BUN: 14 mg/dL (ref 6–20)
CALCIUM ION: 1.13 mmol/L (ref 1.12–1.23)
CHLORIDE: 99 mmol/L — AB (ref 101–111)
Creatinine, Ser: 1.1 mg/dL (ref 0.61–1.24)
Glucose, Bld: 91 mg/dL (ref 65–99)
HEMATOCRIT: 50 % (ref 39.0–52.0)
Hemoglobin: 17 g/dL (ref 13.0–17.0)
POTASSIUM: 4 mmol/L (ref 3.5–5.1)
SODIUM: 139 mmol/L (ref 135–145)
TCO2: 31 mmol/L (ref 0–100)

## 2015-09-08 MED ORDER — FUROSEMIDE 20 MG PO TABS
20.0000 mg | ORAL_TABLET | Freq: Once | ORAL | Status: AC
  Administered 2015-09-08: 20 mg via ORAL
  Filled 2015-09-08: qty 1

## 2015-09-08 NOTE — ED Provider Notes (Signed)
CSN: [id number]     Arrival date & time 09/08/15  1932 History   First MD Initiated Contact with Patient 09/08/15 2005     Chief Complaint  Patient presents with  . Leg Swelling     (Consider location/radiation/quality/duration/timing/severity/associated sxs/prior Treatment) HPI Comments: Here for evaluation of painless, bilateral, right greater than left, lower extremity swelling. Symptoms started 5 days ago. He reports swelling is improved in the mornings and recurs throughout the day, but also states he was at work today without noticeable worsening. No SOB, chest pain, redness, pain or fever. He denies history of treated medical conditions and is on no medications. No significant dietary changes.  The history is provided by the patient. No language interpreter was used.    History reviewed. No pertinent past medical history. Past Surgical History  Procedure Laterality Date  . Skin graft    . Skin graft     History reviewed. No pertinent family history. Social History  Substance Use Topics  . Smoking status: Never Smoker   . Smokeless tobacco: None  . Alcohol Use: Yes     Comment: Occassional Use    Review of Systems  Constitutional: Negative for fever.  Respiratory: Negative for cough and shortness of breath.   Cardiovascular: Positive for leg swelling. Negative for chest pain and palpitations.  Gastrointestinal: Negative for nausea, vomiting and abdominal pain.  Musculoskeletal: Negative for back pain and gait problem.  Skin: Negative for color change and wound.      Allergies  Review of patient's allergies indicates no known allergies.  Home Medications   Prior to Admission medications   Medication Sig Start Date End Date Taking? Authorizing Provider  amoxicillin-clavulanate (AUGMENTIN) 875-125 MG per tablet Take 1 tablet by mouth 2 (two) times daily. 12/12/14   Marisa Severinlga Otter, MD  antipyrine-benzocaine Lyla Son(AURALGAN) otic solution Place 3-4 drops into the right ear  every 2 (two) hours as needed for ear pain. 12/12/14   Marisa Severinlga Otter, MD  HYDROcodone-acetaminophen (NORCO/VICODIN) 5-325 MG per tablet Take 2 tablets by mouth every 4 (four) hours as needed. 12/12/14   Marisa Severinlga Otter, MD  ibuprofen (ADVIL,MOTRIN) 800 MG tablet Take 1 tablet (800 mg total) by mouth every 8 (eight) hours as needed for moderate pain. 06/14/15   Fayrene HelperBowie Tran, PA-C   BP 143/90 mmHg  Pulse 70  Temp(Src) 98.2 F (36.8 C) (Oral)  Resp 20  Ht 5\' 7"  (1.702 m)  Wt 106.777 kg  BMI 36.86 kg/m2  SpO2 99% Physical Exam  Constitutional: He is oriented to person, place, and time. He appears well-developed and well-nourished.  HENT:  Head: Normocephalic.  Neck: Normal range of motion. Neck supple.  Cardiovascular: Normal rate, regular rhythm and intact distal pulses.   Pulmonary/Chest: Effort normal and breath sounds normal.  Abdominal: Soft. Bowel sounds are normal. There is no tenderness. There is no rebound and no guarding.  Musculoskeletal: Normal range of motion.  Right greater than left, uniform, non-pitting swelling of lower extremities. Non-tender. No redness.   Neurological: He is alert and oriented to person, place, and time.  Skin: Skin is warm and dry. No rash noted.  Psychiatric: He has a normal mood and affect.    ED Course  Procedures (including critical care time) Labs Review Labs Reviewed  I-STAT CHEM 8, ED   Results for orders placed or performed during the hospital encounter of 09/08/15  I-stat Chem 8, ED  Result Value Ref Range   Sodium 139 135 - 145 mmol/L   Potassium  4.0 3.5 - 5.1 mmol/L   Chloride 99 (L) 101 - 111 mmol/L   BUN 14 6 - 20 mg/dL   Creatinine, Ser 1.61 0.61 - 1.24 mg/dL   Glucose, Bld 91 65 - 99 mg/dL   Calcium, Ion 0.96 0.45 - 1.23 mmol/L   TCO2 31 0 - 100 mmol/L   Hemoglobin 17.0 13.0 - 17.0 g/dL   HCT 40.9 81.1 - 91.4 %     Imaging Review No results found. I have personally reviewed and evaluated these images and lab results as part of my  medical decision-making.   EKG Interpretation None      MDM   Final diagnoses:  None    1. Bilateral LE swelling  The patient has complaint of multi-day LE swelling without previous history. No other medications conditions. Blood pressure mildly elevated in ED. No SOB or chest pain.  Swelling is bilateral without pain or redness - doubt DVT. Swelling or non-pitting in the setting of normal renal function and no symptoms of SOB or chest pain - doubt CHF in 29-yo patient without other medical history. He appears appropriate for discharge home and is encouraged to follow up with a PCP if swelling persists.     Elpidio Anis, PA-C 09/08/15 7829  Raeford Razor, MD 09/08/15 [phone]

## 2015-09-08 NOTE — ED Notes (Signed)
Onset 09-04-15 bilateral swelling from knees to feet.  No shortness of breath, chest pain or any other s/s noted.  No h/o swelling

## 2015-09-08 NOTE — Discharge Instructions (Signed)
[facility] or with a primary care provider of your choice for further outpatient evaluation and management of leg swelling.   Peripheral Edema You have swelling in your legs (peripheral edema). This swelling is due to excess accumulation of salt and water in your body. Edema may be a sign of heart, kidney or liver disease, or a side effect of a medication. It may also be due to problems in the leg veins. Elevating your legs and using special support stockings may be very helpful, if the cause of the swelling is due to poor venous circulation. Avoid long periods of standing, whatever the cause. Treatment of edema depends on identifying the cause. Chips, pretzels, pickles and other salty foods should be avoided. Restricting salt in your diet is almost always needed. Water pills (diuretics) are often used to remove the excess salt and water from your body via urine. These medicines prevent the kidney from reabsorbing sodium. This increases urine flow. Diuretic treatment may also result in lowering of potassium levels in your body. Potassium supplements may be needed if you have to use diuretics daily. Daily weights can help you keep track of your progress in clearing your edema. You should call your caregiver for follow up care as recommended. SEEK IMMEDIATE MEDICAL CARE IF:   You have increased swelling, pain, redness, or heat in your legs.  You develop shortness of breath, especially when lying down.  You develop chest or abdominal pain, weakness, or fainting.  You have a fever.   This information is not intended to replace advice given to you by your health care provider. Make sure you discuss any questions you have with your health care provider.   Document Released: 10/22/2004 Document Revised: 12/07/2011 Document Reviewed: 03/27/2015 Elsevier Interactive Patient Education [employer]2016 [employer]

## 2015-12-03 ENCOUNTER — Encounter (HOSPITAL_COMMUNITY): Payer: Self-pay

## 2015-12-03 ENCOUNTER — Emergency Department (INDEPENDENT_AMBULATORY_CARE_PROVIDER_SITE_OTHER)
Admission: EM | Admit: 2015-12-03 | Discharge: 2015-12-03 | Disposition: A | Discharge status: Home / Self Care | Payer: BLUE CROSS/BLUE SHIELD | Source: Home / Self Care | Attending: Family Medicine | Admitting: Family Medicine

## 2015-12-03 DIAGNOSIS — J111 Influenza due to unidentified influenza virus with other respiratory manifestations: Secondary | ICD-10-CM

## 2015-12-03 MED ORDER — KETOROLAC TROMETHAMINE 60 MG/2ML IM SOLN
60.0000 mg | Freq: Once | INTRAMUSCULAR | Status: AC
  Administered 2015-12-03: 60 mg via INTRAMUSCULAR

## 2015-12-03 MED ORDER — KETOROLAC TROMETHAMINE 60 MG/2ML IM SOLN
INTRAMUSCULAR | Status: AC
  Filled 2015-12-03: qty 2

## 2015-12-03 MED ORDER — ONDANSETRON HCL 4 MG PO TABS: 4.0000 mg | ORAL_TABLET | Freq: Four times a day (QID) | ORAL | Status: DC

## 2015-12-03 NOTE — Discharge Instructions (Signed)

## 2015-12-03 NOTE — ED Provider Notes (Addendum)
CSN: [id number]     Arrival date & time 12/03/15  1739 History   First MD Initiated Contact with Patient 12/03/15 1839     Chief Complaint  Patient presents with  . Influenza   (Consider location/radiation/quality/duration/timing/severity/associated sxs/prior Treatment) HPI Pt presents with sore throat, body aches, fever, chills for 1 days Home treatment has been OTC meds without much relief of symptoms Fever is improved for short periods of time with OTC antipyretics. Pain score is 4 mostly from coughing and body aches Taking fluids, no appetite No flu shot Has been exposed to others with similar symptoms.  vomiting Denies: CP, SOB, diarrhea.  History reviewed. No pertinent past medical history. Past Surgical History  Procedure Laterality Date  . Skin graft    . Skin graft     No family history on file. Social History  Substance Use Topics  . Smoking status: Never Smoker   . Smokeless tobacco: Former NeurosurgeonUser  . Alcohol Use: Yes     Comment: Occassional Use    Review of Systems See HPI Allergies  Review of patient's allergies indicates no known allergies.  Home Medications   Prior to Admission medications   Medication Sig Start Date End Date Taking? Authorizing Provider  amoxicillin-clavulanate (AUGMENTIN) 875-125 MG per tablet Take 1 tablet by mouth 2 (two) times daily. 12/12/14   Marisa Severinlga Otter, MD  antipyrine-benzocaine Lyla Son(AURALGAN) otic solution Place 3-4 drops into the right ear every 2 (two) hours as needed for ear pain. 12/12/14   Marisa Severinlga Otter, MD  HYDROcodone-acetaminophen (NORCO/VICODIN) 5-325 MG per tablet Take 2 tablets by mouth every 4 (four) hours as needed. 12/12/14   Marisa Severinlga Otter, MD  ibuprofen (ADVIL,MOTRIN) 800 MG tablet Take 1 tablet (800 mg total) by mouth every 8 (eight) hours as needed for moderate pain. 06/14/15   Fayrene HelperBowie Tran, PA-C   Meds Ordered and Administered this Visit   Medications  ketorolac (TORADOL) injection 60 mg (60 mg Intramuscular Given 12/03/15 1914)   toradol administered by nurse prior to discharge.  BP 132/75 mmHg  Pulse 98  Temp(Src) 102.4 F (39.1 C) (Oral)  Resp 16  SpO2 100% No data found.   Physical Exam NURSES NOTES AND VITAL SIGNS REVIEWED. CONSTITUTIONAL: Well developed, well nourished, no acute distress HEENT: normocephalic, atraumatic, right and left TM's are normal EYES: Conjunctiva normal NECK:normal ROM, supple, no adenopathy PULMONARY:No respiratory distress, normal effort, Lungs: CTAb/l, no wheezes, or increased work of breathing CARDIOVASCULAR: RRR, no murmur ABDOMEN: soft, ND, NT, +'ve BS MUSCULOSKELETAL: Normal ROM of all extremities,  SKIN: warm and dry without rash PSYCHIATRIC: Mood and affect, behavior are normal  ED Course  Procedures (including critical care time)  Labs Review Labs Reviewed - No data to display  Imaging Review No results found.   Visual Acuity Review  Right Eye Distance:   Left Eye Distance:   Bilateral Distance:    Right Eye Near:   Left Eye Near:    Bilateral Near:         MDM   1. Flu    Patient is reassured that there are no issues that require transfer to higher level of care at this time.  Patient is advised to continue home symptomatic treatment. Prescription is sent to  pharmacy patient has indicated.  Patient is advised that if there are new or worsening symptoms or attend the emergency department, or contact primary care provider. Instructions of care provided discharged home in stable condition. Return to work/school note provided.  THIS NOTE WAS  GENERATED USING A VOICE RECOGNITION SOFTWARE PROGRAM. ALL REASONABLE EFFORTS  WERE MADE TO PROOFREAD THIS DOCUMENT FOR ACCURACY.     Tharon Aquas, PA 12/03/15 2000  Tharon Aquas, [place] 02/06/16 [phone]

## 2015-12-03 NOTE — ED Notes (Signed)
30 y.o./male presents with flu-like symptoms and fever of 102.4 x2 days Patient has been taking Thera flu on last night 12/02/2015

## 2015-12-18 ENCOUNTER — Inpatient Hospital Stay (HOSPITAL_COMMUNITY)
Admission: EM | Admit: 2015-12-18 | Discharge: 2015-12-20 | DRG: 251 | Disposition: A | Discharge status: Home / Self Care | Payer: BLUE CROSS/BLUE SHIELD | Attending: Cardiovascular Disease | Admitting: Cardiovascular Disease

## 2015-12-18 ENCOUNTER — Encounter (HOSPITAL_COMMUNITY)
Admission: EM | Disposition: A | Discharge status: Home / Self Care | Payer: Self-pay | Source: Home / Self Care | Attending: Cardiovascular Disease

## 2015-12-18 DIAGNOSIS — Z23 Encounter for immunization: Secondary | ICD-10-CM

## 2015-12-18 DIAGNOSIS — I2102 ST elevation (STEMI) myocardial infarction involving left anterior descending coronary artery: Secondary | ICD-10-CM | POA: Diagnosis present

## 2015-12-18 DIAGNOSIS — E876 Hypokalemia: Secondary | ICD-10-CM | POA: Diagnosis present

## 2015-12-18 DIAGNOSIS — I472 Ventricular tachycardia: Secondary | ICD-10-CM | POA: Diagnosis present

## 2015-12-18 DIAGNOSIS — R079 Chest pain, unspecified: Secondary | ICD-10-CM | POA: Diagnosis present

## 2015-12-18 DIAGNOSIS — M79662 Pain in left lower leg: Secondary | ICD-10-CM | POA: Diagnosis not present

## 2015-12-18 DIAGNOSIS — E785 Hyperlipidemia, unspecified: Secondary | ICD-10-CM | POA: Diagnosis present

## 2015-12-18 DIAGNOSIS — M79661 Pain in right lower leg: Secondary | ICD-10-CM

## 2015-12-18 DIAGNOSIS — I10 Essential (primary) hypertension: Secondary | ICD-10-CM | POA: Diagnosis present

## 2015-12-18 DIAGNOSIS — I2510 Atherosclerotic heart disease of native coronary artery without angina pectoris: Secondary | ICD-10-CM

## 2015-12-18 HISTORY — PX: CARDIAC CATHETERIZATION: SHX172

## 2015-12-18 HISTORY — DX: Acute myocardial infarction, unspecified: I21.9

## 2015-12-18 HISTORY — PX: PERIPHERAL VASCULAR CATHETERIZATION: SHX172C

## 2015-12-18 HISTORY — DX: Hyperlipidemia, unspecified: E78.5

## 2015-12-18 HISTORY — DX: Essential (primary) hypertension: I10

## 2015-12-18 LAB — CBC
HEMATOCRIT: 42.9 % (ref 39.0–52.0)
HEMOGLOBIN: 15 g/dL (ref 13.0–17.0)
MCH: 29.5 pg (ref 26.0–34.0)
MCHC: 35 g/dL (ref 30.0–36.0)
MCV: 84.3 fL (ref 78.0–100.0)
Platelets: 337 10*3/uL (ref 150–400)
RBC: 5.09 MIL/uL (ref 4.22–5.81)
RDW: 11.7 % (ref 11.5–15.5)
WBC: 10.1 10*3/uL (ref 4.0–10.5)

## 2015-12-18 LAB — POCT I-STAT, CHEM 8
BUN: 12 mg/dL (ref 6–20)
CHLORIDE: 102 mmol/L (ref 101–111)
CREATININE: 0.8 mg/dL (ref 0.61–1.24)
Calcium, Ion: 1.1 mmol/L — ABNORMAL LOW (ref 1.12–1.23)
GLUCOSE: 135 mg/dL — AB (ref 65–99)
HCT: 45 % (ref 39.0–52.0)
Hemoglobin: 15.3 g/dL (ref 13.0–17.0)
POTASSIUM: 2.7 mmol/L — AB (ref 3.5–5.1)
Sodium: 136 mmol/L (ref 135–145)
TCO2: 21 mmol/L (ref 0–100)

## 2015-12-18 LAB — PROTIME-INR
INR: 1.01 (ref 0.00–1.49)
Prothrombin Time: 13.5 seconds (ref 11.6–15.2)

## 2015-12-18 LAB — COMPREHENSIVE METABOLIC PANEL
ALBUMIN: 1.5 g/dL — AB (ref 3.5–5.0)
ALT: 22 U/L (ref 17–63)
AST: 32 U/L (ref 15–41)
Alkaline Phosphatase: 41 U/L (ref 38–126)
Anion gap: 12 (ref 5–15)
BUN: 9 mg/dL (ref 6–20)
CHLORIDE: 104 mmol/L (ref 101–111)
CO2: 20 mmol/L — AB (ref 22–32)
Calcium: 8 mg/dL — ABNORMAL LOW (ref 8.9–10.3)
Creatinine, Ser: 1.05 mg/dL (ref 0.61–1.24)
GFR calc Af Amer: >60 mL/min (ref >60)
GFR calc non Af Amer: >60 mL/min (ref >60)
GLUCOSE: 136 mg/dL — AB (ref 65–99)
POTASSIUM: 2.8 mmol/L — AB (ref 3.5–5.1)
SODIUM: 136 mmol/L (ref 135–145)
Total Bilirubin: 0.7 mg/dL (ref 0.3–1.2)
Total Protein: 4.2 g/dL — ABNORMAL LOW (ref 6.5–8.1)

## 2015-12-18 LAB — MRSA PCR SCREENING: MRSA by PCR: NEGATIVE

## 2015-12-18 LAB — CK TOTAL AND CKMB (NOT AT ARMC)
CK, MB: 12.1 ng/mL — AB (ref 0.5–5.0)
RELATIVE INDEX: 2.1 (ref 0.0–2.5)
Total CK: 574 U/L — ABNORMAL HIGH (ref 49–397)

## 2015-12-18 LAB — LIPID PANEL
CHOL/HDL RATIO: 11 ratio
CHOLESTEROL: 464 mg/dL — AB (ref 0–200)
HDL: 42 mg/dL (ref >40)
LDL CALC: 356 mg/dL — AB (ref 0–99)
Triglycerides: 330 mg/dL — ABNORMAL HIGH (ref <150)
VLDL: 66 mg/dL — AB (ref 0–40)

## 2015-12-18 LAB — APTT: aPTT: 25 seconds (ref 24–37)

## 2015-12-18 LAB — MAGNESIUM
MAGNESIUM: 1.6 mg/dL — AB (ref 1.7–2.4)
Magnesium: 1.3 mg/dL — ABNORMAL LOW (ref 1.7–2.4)

## 2015-12-18 LAB — TSH: TSH: 1.551 u[IU]/mL (ref 0.350–4.500)

## 2015-12-18 LAB — POCT ACTIVATED CLOTTING TIME: Activated Clotting Time: 358 seconds

## 2015-12-18 LAB — TROPONIN I
Troponin I: 1.13 ng/mL (ref <0.031)
Troponin I: 3.49 ng/mL (ref <0.031)

## 2015-12-18 SURGERY — LEFT HEART CATH AND CORONARY ANGIOGRAPHY
Anesthesia: LOCAL

## 2015-12-18 MED ORDER — FENTANYL CITRATE (PF) 100 MCG/2ML IJ SOLN
INTRAMUSCULAR | Status: DC | PRN
  Administered 2015-12-18 (×3): 50 ug via INTRAVENOUS

## 2015-12-18 MED ORDER — ATORVASTATIN CALCIUM 80 MG PO TABS
80.0000 mg | ORAL_TABLET | Freq: Every day | ORAL | Status: DC
  Administered 2015-12-19: 80 mg via ORAL
  Filled 2015-12-18: qty 1

## 2015-12-18 MED ORDER — SODIUM CHLORIDE 0.9% FLUSH
3.0000 mL | Freq: Two times a day (BID) | INTRAVENOUS | Status: DC
Start: 2015-12-18 — End: 2015-12-19
  Administered 2015-12-18 – 2015-12-19 (×2): 3 mL via INTRAVENOUS

## 2015-12-18 MED ORDER — TICAGRELOR 90 MG PO TABS
ORAL_TABLET | ORAL | Status: AC
  Filled 2015-12-18: qty 1

## 2015-12-18 MED ORDER — ONDANSETRON HCL 4 MG/2ML IJ SOLN
INTRAMUSCULAR | Status: AC
  Filled 2015-12-18: qty 2

## 2015-12-18 MED ORDER — TICAGRELOR 90 MG PO TABS
ORAL_TABLET | ORAL | Status: DC | PRN
  Administered 2015-12-18: 180 mg via ORAL

## 2015-12-18 MED ORDER — ONDANSETRON HCL 4 MG/2ML IJ SOLN
INTRAMUSCULAR | Status: DC | PRN
  Administered 2015-12-18: 4 mg via INTRAVENOUS

## 2015-12-18 MED ORDER — TIROFIBAN HCL IN NACL 5-0.9 MG/100ML-% IV SOLN
0.1500 ug/kg/min | INTRAVENOUS | Status: AC
  Administered 2015-12-18 – 2015-12-19 (×4): 0.15 ug/kg/min via INTRAVENOUS
  Filled 2015-12-18 (×3): qty 100

## 2015-12-18 MED ORDER — NITROGLYCERIN 1 MG/10 ML FOR IR/CATH LAB
INTRA_ARTERIAL | Status: AC
  Filled 2015-12-18: qty 10

## 2015-12-18 MED ORDER — IOPAMIDOL (ISOVUE-370) INJECTION 76%
INTRAVENOUS | Status: DC | PRN
  Administered 2015-12-18: 260 mL

## 2015-12-18 MED ORDER — TICAGRELOR 90 MG PO TABS
90.0000 mg | ORAL_TABLET | Freq: Two times a day (BID) | ORAL | Status: DC
  Administered 2015-12-18 – 2015-12-20 (×4): 90 mg via ORAL
  Filled 2015-12-18 (×4): qty 1

## 2015-12-18 MED ORDER — ACETAMINOPHEN 325 MG PO TABS
650.0000 mg | ORAL_TABLET | ORAL | Status: DC | PRN
  Administered 2015-12-18 – 2015-12-19 (×2): 650 mg via ORAL
  Filled 2015-12-18 (×2): qty 2

## 2015-12-18 MED ORDER — HEPARIN SODIUM (PORCINE) 1000 UNIT/ML IJ SOLN: INTRAMUSCULAR | Status: DC | PRN

## 2015-12-18 MED ORDER — METOPROLOL TARTRATE 1 MG/ML IV SOLN
INTRAVENOUS | Status: AC
  Filled 2015-12-18: qty 5

## 2015-12-18 MED ORDER — TIROFIBAN (AGGRASTAT) BOLUS VIA INFUSION
INTRAVENOUS | Status: DC | PRN
  Administered 2015-12-18: 2650 ug via INTRAVENOUS

## 2015-12-18 MED ORDER — SODIUM CHLORIDE 0.9 % IV SOLN: 250.0000 mL | INTRAVENOUS | Status: DC | PRN

## 2015-12-18 MED ORDER — VERAPAMIL HCL 2.5 MG/ML IV SOLN
INTRAVENOUS | Status: AC
  Filled 2015-12-18: qty 2

## 2015-12-18 MED ORDER — SODIUM CHLORIDE 0.9 % IV SOLN
INTRAVENOUS | Status: DC
Start: 2015-12-18 — End: 2015-12-19
  Administered 2015-12-18 – 2015-12-19 (×2): via INTRAVENOUS

## 2015-12-18 MED ORDER — HEPARIN (PORCINE) IN NACL 2-0.9 UNIT/ML-% IJ SOLN
INTRAMUSCULAR | Status: AC
Start: 2015-12-18 — End: 2015-12-18
  Filled 2015-12-18: qty 1500

## 2015-12-18 MED ORDER — POTASSIUM CHLORIDE CRYS ER 20 MEQ PO TBCR
40.0000 meq | EXTENDED_RELEASE_TABLET | ORAL | Status: AC
  Administered 2015-12-18 – 2015-12-19 (×2): 40 meq via ORAL
  Filled 2015-12-18 (×2): qty 2

## 2015-12-18 MED ORDER — MIDAZOLAM HCL 2 MG/2ML IJ SOLN
INTRAMUSCULAR | Status: AC
  Filled 2015-12-18: qty 2

## 2015-12-18 MED ORDER — NITROGLYCERIN 1 MG/10 ML FOR IR/CATH LAB
INTRA_ARTERIAL | Status: DC | PRN
  Administered 2015-12-18: 200 ug via INTRACORONARY

## 2015-12-18 MED ORDER — ONDANSETRON HCL 4 MG/2ML IJ SOLN: 4.0000 mg | Freq: Four times a day (QID) | INTRAMUSCULAR | Status: DC | PRN

## 2015-12-18 MED ORDER — BIVALIRUDIN 250 MG IV SOLR
INTRAVENOUS | Status: AC
  Filled 2015-12-18: qty 250

## 2015-12-18 MED ORDER — INFLUENZA VAC SPLIT QUAD 0.5 ML IM SUSY
0.5000 mL | PREFILLED_SYRINGE | INTRAMUSCULAR | Status: DC
  Filled 2015-12-18: qty 0.5

## 2015-12-18 MED ORDER — FENTANYL CITRATE (PF) 100 MCG/2ML IJ SOLN
INTRAMUSCULAR | Status: AC
  Filled 2015-12-18: qty 2

## 2015-12-18 MED ORDER — NITROGLYCERIN IN D5W 200-5 MCG/ML-% IV SOLN
INTRAVENOUS | Status: AC
  Filled 2015-12-18: qty 250

## 2015-12-18 MED ORDER — MIDAZOLAM HCL 2 MG/2ML IJ SOLN
INTRAMUSCULAR | Status: DC | PRN
  Administered 2015-12-18 (×2): 2 mg via INTRAVENOUS

## 2015-12-18 MED ORDER — TIROFIBAN HCL IN NACL 5-0.9 MG/100ML-% IV SOLN
INTRAVENOUS | Status: AC
  Filled 2015-12-18: qty 100

## 2015-12-18 MED ORDER — VERAPAMIL HCL 2.5 MG/ML IV SOLN
INTRAVENOUS | Status: DC | PRN
  Administered 2015-12-18: 16:00:00 via INTRA_ARTERIAL

## 2015-12-18 MED ORDER — IOPAMIDOL (ISOVUE-370) INJECTION 76%
INTRAVENOUS | Status: AC
  Filled 2015-12-18: qty 100

## 2015-12-18 MED ORDER — POTASSIUM CHLORIDE 10 MEQ/100ML IV SOLN
INTRAVENOUS | Status: DC | PRN
  Administered 2015-12-18 (×2): 10 meq via INTRAVENOUS

## 2015-12-18 MED ORDER — POTASSIUM CHLORIDE 10 MEQ/50ML IV SOLN: INTRAVENOUS | Status: DC | PRN

## 2015-12-18 MED ORDER — POTASSIUM CHLORIDE 10 MEQ/100ML IV SOLN
INTRAVENOUS | Status: AC
  Filled 2015-12-18: qty 100

## 2015-12-18 MED ORDER — LIDOCAINE HCL (PF) 1 % IJ SOLN
INTRAMUSCULAR | Status: AC
  Filled 2015-12-18: qty 60

## 2015-12-18 MED ORDER — SODIUM CHLORIDE 0.9 % IV SOLN
250.0000 mg | INTRAVENOUS | Status: DC | PRN
  Administered 2015-12-18: 250 mg
  Administered 2015-12-18: 1.75 mg/kg/h via INTRAVENOUS

## 2015-12-18 MED ORDER — ASPIRIN 81 MG PO CHEW
81.0000 mg | CHEWABLE_TABLET | Freq: Every day | ORAL | Status: DC
  Administered 2015-12-19 – 2015-12-20 (×2): 81 mg via ORAL
  Filled 2015-12-18 (×2): qty 1

## 2015-12-18 MED ORDER — METOPROLOL TARTRATE 25 MG PO TABS
25.0000 mg | ORAL_TABLET | Freq: Two times a day (BID) | ORAL | Status: DC
  Administered 2015-12-18 – 2015-12-20 (×4): 25 mg via ORAL
  Filled 2015-12-18 (×4): qty 1

## 2015-12-18 MED ORDER — SODIUM CHLORIDE 0.9% FLUSH: 3.0000 mL | INTRAVENOUS | Status: DC | PRN

## 2015-12-18 MED ORDER — NITROGLYCERIN IN D5W 200-5 MCG/ML-% IV SOLN
0.0000 ug/min | INTRAVENOUS | Status: DC
  Administered 2015-12-18: 50 ug/min via INTRAVENOUS

## 2015-12-18 MED ORDER — BIVALIRUDIN BOLUS VIA INFUSION - CUPID
INTRAVENOUS | Status: DC | PRN
  Administered 2015-12-18: 79.5 mg via INTRAVENOUS

## 2015-12-18 MED ORDER — METOPROLOL TARTRATE 1 MG/ML IV SOLN
INTRAVENOUS | Status: DC | PRN
  Administered 2015-12-18 (×2): 2.5 mg via INTRAVENOUS

## 2015-12-18 MED ORDER — MAGNESIUM SULFATE 2 GM/50ML IV SOLN
2.0000 g | Freq: Once | INTRAVENOUS | Status: AC
  Administered 2015-12-18: 2 g via INTRAVENOUS
  Filled 2015-12-18: qty 50

## 2015-12-18 MED ORDER — LIDOCAINE HCL (PF) 1 % IJ SOLN
INTRAMUSCULAR | Status: DC | PRN
  Administered 2015-12-18: 5 mL

## 2015-12-18 MED ORDER — NITROGLYCERIN IN D5W 200-5 MCG/ML-% IV SOLN
INTRAVENOUS | Status: DC | PRN
  Administered 2015-12-18: 10 ug/min via INTRAVENOUS

## 2015-12-18 MED ORDER — TIROFIBAN HCL IN NACL 5-0.9 MG/100ML-% IV SOLN
INTRAVENOUS | Status: DC | PRN
  Administered 2015-12-18: 0.15 ug/kg/min via INTRAVENOUS

## 2015-12-18 MED ORDER — FENTANYL CITRATE (PF) 100 MCG/2ML IJ SOLN
INTRAMUSCULAR | Status: AC
Start: 2015-12-18 — End: 2015-12-18
  Filled 2015-12-18: qty 2

## 2015-12-18 MED ORDER — MAGNESIUM OXIDE 400 (241.3 MG) MG PO TABS
400.0000 mg | ORAL_TABLET | ORAL | Status: AC
  Administered 2015-12-18 – 2015-12-19 (×2): 400 mg via ORAL
  Filled 2015-12-18 (×2): qty 1

## 2015-12-18 SURGICAL SUPPLY — 19 items
BALLN EUPHORA RX 2.0X15 (BALLOONS) ×3
BALLOON EUPHORA RX 2.0X15 (BALLOONS) ×2 IMPLANT
CATH EXTRAC PRONTO 5.5F 138CM (CATHETERS) ×3 IMPLANT
CATH EXTRAC PRONTO LP 6F RND (CATHETERS) ×3 IMPLANT
CATH INFINITI 5FR ANG PIGTAIL (CATHETERS) ×3 IMPLANT
CATH INFINITI JR4 5F (CATHETERS) ×3 IMPLANT
CATH VISTA GUIDE 6FR JL3.5 (CATHETERS) ×3 IMPLANT
CATH VISTA GUIDE 6FR XB3.5 (CATHETERS) ×3 IMPLANT
DEVICE RAD COMP TR BAND LRG (VASCULAR PRODUCTS) ×3 IMPLANT
ELECT DEFIB PAD ADLT CADENCE (PAD) ×3 IMPLANT
GLIDESHEATH SLEND SS 6F .021 (SHEATH) ×6 IMPLANT
KIT ENCORE 26 ADVANTAGE (KITS) ×3 IMPLANT
KIT HEART LEFT (KITS) ×3 IMPLANT
PACK CARDIAC CATHETERIZATION (CUSTOM PROCEDURE TRAY) ×3 IMPLANT
SYR MEDRAD MARK V 150ML (SYRINGE) ×3 IMPLANT
TRANSDUCER W/STOPCOCK (MISCELLANEOUS) ×3 IMPLANT
TUBING CIL FLEX 10 FLL-RA (TUBING) ×3 IMPLANT
WIRE COUGAR XT STRL 190CM (WIRE) ×3 IMPLANT
WIRE SAFE-T 1.5MM-J .035X260CM (WIRE) ×3 IMPLANT

## 2015-12-18 NOTE — Progress Notes (Signed)
Bp in the 130s-150s hr in the 80s on ntg gtt-notified Dr Onalee HuaAlvarez w/ order for Metoprolol 25 BID.Pt still some HA from NTG.

## 2015-12-18 NOTE — Progress Notes (Signed)
Pt having runs of Vtac 5-6 bit run  And small svt run notified Dr Onalee HuaAlvarez w/ order for potassium and mag ox and mgso4 iv x 1.

## 2015-12-18 NOTE — H&P (Signed)
Patient ID: Justin Valdez MRN: [id number], DOB/AGE: December 03, 1985   Admit date: 12/18/2015  Consulting Physician: Melburn Hake   Primary Physician: Pcp Not In System Primary Cardiologist: New- Dr. Tresa Endo Reason for admission: inferolateral STEMI  Pt. Profile:  Justin Valdez is a 30 y.o. male with no past medical history who was brought to [facility] via EMS today as a CODE STEMI.  He was in his usual state of health until last night when he noted some chest pain. He took some OTC pain mediation and was able to sleep and go to work the next day. 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 ECG in the field showed ST elevation in inferior and lateral leads. He was brought to [facility] cath lab by EMS for emergent coronary angiography.   He denies a family hx of CAD, HTN, HLD, DM, CVA or any other chronic medical illnesses. He does not smoke or use drugs.    Problem List  No past medical history on file.  Past Surgical History  Procedure Laterality Date  . Skin graft    . Skin graft       Allergies  No Known Allergies   Home Medications  Prior to Admission medications   Medication Sig Start Date End Date Taking? Authorizing Provider  amoxicillin-clavulanate (AUGMENTIN) 875-125 MG per tablet Take 1 tablet by mouth 2 (two) times daily. 12/12/14   Marisa Severin, MD  antipyrine-benzocaine Lyla Son) otic solution Place 3-4 drops into the right ear every 2 (two) hours as needed for ear pain. 12/12/14   Marisa Severin, MD  HYDROcodone-acetaminophen (NORCO/VICODIN) 5-325 MG per tablet Take 2 tablets by mouth every 4 (four) hours as needed. 12/12/14   Marisa Severin, MD  ibuprofen (ADVIL,MOTRIN) 800 MG tablet Take 1 tablet (800 mg total) by mouth every 8 (eight) hours as needed for moderate pain. 06/14/15   Fayrene Helper, PA-C  ondansetron (ZOFRAN) 4 MG tablet Take 1 tablet (4 mg total) by mouth every 6 (six) hours. 12/03/15   Tharon Aquas, PA     Family History  No family history on file. No family status information on file.     Social History  Social History   Social History  . Marital Status: Married    Spouse Name: N/A  . Number of Children: N/A  . Years of Education: N/A   Occupational History  . Not on file.   Social History Main Topics  . Smoking status: Never Smoker   . Smokeless tobacco: Former Neurosurgeon  . Alcohol Use: Yes     Comment: Occassional Use  . Drug Use: No  . Sexual Activity: No   Other Topics Concern  . Not on file   Social History Narrative      All other systems reviewed and are otherwise negative except as noted above.  Physical Exam  SpO2 100 %.  General: Pleasant, NAD Psych: Normal affect. Neuro: Alert and oriented X 3. Moves all extremities spontaneously. HEENT: Normal  Neck: Supple without bruits or JVD. Lungs:  Resp regular and unlabored, CTA. Heart: RRR no s3, s4, or murmurs. Abdomen: Soft, non-tender, non-distended, BS + x 4.  Extremities: No clubbing, cyanosis or edema. DP/PT/Radials 2+ and equal bilaterally.  Labs  No results for input(s): CKTOTAL, CKMB, TROPONINI in the last 72 hours. Lab Results  Component Value Date   HGB 17.0 09/08/2015   HCT 50.0 09/08/2015   No results  for input(s): NA, K, CL, CO2, BUN, CREATININE, CALCIUM, PROT, BILITOT, ALKPHOS, ALT, AST, GLUCOSE in the last 168 hours.  Invalid input(s): LABALBU No results found for: CHOL, HDL, LDLCALC, TRIG No results found for: DDIMER   Radiology/Studies  No results found.  ECG  inferolat ST elevation  ASSESSMENT AND PLAN   Justin Valdez is a 30 y.o. male with no past medical history who [facility] today as a CODE STEMI. He denies a family hx of CAD, HTN, HLD, DM, CVA or any other chronic medical illnesses. He does not smoke or use drugs.   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 ECG in the field showed ST  elevation in inferior and lateral leads. [facility] by [facility] for emergent coronary angiography.   Plan: LHC with possible PCI.   Billy FischerSigned, THOMPSON, KATHRYN R, PA-C 12/18/2015, 4:47 PM  Pager [phone]  Patient seen and examined. Agree with assessment and plan.  The patient denies any known cardiac history or history of hypertension.  He [facility].  Last evening he began to notice some leg discomfort for which he had taken Naprosyn.  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  EMS was notified and he was found to have inferolateral ST elevation.  A code STEMI was activated and he was [facility] catheterization laboratory for urgent cardiac catheterization and possible intervention.  The patient states that he has continued to experience left arm discomfort and right arm numbness.  Upon arrival to the catheterization laboratory describes his pain as 10 out of 10.  He denies any history of tobacco use.  Plan emergent cardiac catheterization with probable intervention if needed.   Lennette Biharihomas A. Kelly, MD, [facility] 12/18/2015 11:06 PM

## 2015-12-19 ENCOUNTER — Encounter (HOSPITAL_COMMUNITY): Payer: Self-pay | Admitting: *Deleted

## 2015-12-19 DIAGNOSIS — E785 Hyperlipidemia, unspecified: Secondary | ICD-10-CM

## 2015-12-19 DIAGNOSIS — E876 Hypokalemia: Secondary | ICD-10-CM

## 2015-12-19 LAB — BASIC METABOLIC PANEL
Anion gap: 6 (ref 5–15)
BUN: 6 mg/dL (ref 6–20)
CHLORIDE: 107 mmol/L (ref 101–111)
CO2: 25 mmol/L (ref 22–32)
CREATININE: 0.86 mg/dL (ref 0.61–1.24)
Calcium: 7.6 mg/dL — ABNORMAL LOW (ref 8.9–10.3)
GFR calc Af Amer: >60 mL/min (ref >60)
GFR calc non Af Amer: >60 mL/min (ref >60)
GLUCOSE: 107 mg/dL — AB (ref 65–99)
Potassium: 4.3 mmol/L (ref 3.5–5.1)
Sodium: 138 mmol/L (ref 135–145)

## 2015-12-19 LAB — CBC
HCT: 41.4 % (ref 39.0–52.0)
Hemoglobin: 13.9 g/dL (ref 13.0–17.0)
MCH: 28.8 pg (ref 26.0–34.0)
MCHC: 33.6 g/dL (ref 30.0–36.0)
MCV: 85.9 fL (ref 78.0–100.0)
PLATELETS: 374 10*3/uL (ref 150–400)
RBC: 4.82 MIL/uL (ref 4.22–5.81)
RDW: 12.2 % (ref 11.5–15.5)
WBC: 8.3 10*3/uL (ref 4.0–10.5)

## 2015-12-19 LAB — HEMOGLOBIN A1C
Hgb A1c MFr Bld: 5.6 % (ref 4.8–5.6)
Mean Plasma Glucose: 114 mg/dL

## 2015-12-19 LAB — TROPONIN I: Troponin I: 31.7 ng/mL (ref <0.031)

## 2015-12-19 LAB — MAGNESIUM: Magnesium: 2.2 mg/dL (ref 1.7–2.4)

## 2015-12-19 MED ORDER — LOSARTAN POTASSIUM 25 MG PO TABS
25.0000 mg | ORAL_TABLET | Freq: Every day | ORAL | Status: DC
  Administered 2015-12-19 – 2015-12-20 (×2): 25 mg via ORAL
  Filled 2015-12-19 (×2): qty 1

## 2015-12-19 MED FILL — Heparin Sodium (Porcine) 2 Unit/ML in Sodium Chloride 0.9%: INTRAMUSCULAR | Qty: 1000 | Status: AC

## 2015-12-19 NOTE — Progress Notes (Addendum)
CARDIAC REHAB PHASE I   PRE:  Rate/Rhythm: 67 SR    BP: sitting 137/95    SaO2:   MODE:  Ambulation: 250 ft   POST:  Rate/Rhythm: 77 SR    BP: sitting 149/106     SaO2:   Pt eager to walk however upon standing c/o significant bilateral calf pain. (Sts his MI started with severe calf pain yesterday and he was unable to walk for a while. CP/back pain began later.) Pt able to walk short distance although sts his calves are tight/painful/like a cramp and would rate it 8/10. Sts pain did not change or relax with ambulation. To recliner. Began discussing MI, restrictions, diet. Pt overwhelmed and trying to get his head around it all. Family present and seem supportive. Will continue to walk and ed tomorrow. To watch video and read information today. [id number]   Harriet MassonRandi Kristan Ruby Dilone CES, ACSM 12/19/2015 12:26 PM

## 2015-12-19 NOTE — Progress Notes (Signed)
Pt had the flu 12/03/2015, flu shot not administered at this time per pharm

## 2015-12-19 NOTE — Progress Notes (Signed)
ANTICOAGULATION CONSULT NOTE   Pharmacy Consult for tirofiban x 18 hrs Indication: s/p cath lab  No Known Allergies  Patient Measurements: Height: 5\' 7"  (170.2 cm) Weight: 216 lb 11.4 oz (98.3 kg) IBW/kg (Calculated) : 66.1 Heparin Dosing Weight: n/a  Labs:  Recent Labs  12/18/15 1630 12/18/15 1632 12/18/15 1922 12/19/15 0500  HGB 15.0 15.3  --  13.9  HCT 42.9 45.0  --  41.4  PLT 337  --   --  374  APTT 25  --   --   --   LABPROT 13.5  --   --   --   INR 1.01  --   --   --   CREATININE 1.05 0.80  --  0.86  CKTOTAL 574*  --   --   --   CKMB 12.1*  --   --   --   TROPONINI 1.13*  --  3.49* 31.70*    Estimated Creatinine Clearance: 137.8 mL/min (by C-G formula based on Cr of 0.86).  . sodium chloride Stopped (12/19/15 0846)  . nitroGLYCERIN Stopped (12/19/15 0600)  . tirofiban 0.15 mcg/kg/min (12/19/15 0800)    Medical History: Past Medical History  Diagnosis Date  . Myocardial infarction [facility](HCC)     Assessment: 30 yo male s/p cath lab, PCI, thrombectomy to continue tirofiban x 18 hrs (ends at 1315 today).  CBC/Pltc fairly stable on 0.15 mcg/kg/min.  No bleeding or complications noted.   Goal of Therapy:  Monitor platelets by anticoagulation protocol: Yes   Plan:  1. Continue tirofiban at current rate until ends this afternoon. 2. Pharmacy will sign-off.  Tad MooreJessica Ellyson Rarick, Pharm D, BCPS  Clinical Pharmacist Pager [phone]  12/19/2015 9:23 AM

## 2015-12-19 NOTE — Progress Notes (Signed)
Hospital Problem List     Active Problems:   ST elevation myocardial infarction involving left anterior descending (LAD) coronary artery (HCC)   ST elevation (STEMI) myocardial infarction involving left anterior descending coronary artery (HCC)   HLD (hyperlipidemia)    Patient Profile:   Primary Cardiologist: New - Dr. Tresa Endo  30 yo male w/ no significant PMH who developed chest pain on 12/18/2015 and EMS was called and showed an inferior and lateral ST elevation. Taken emergently to the catheterization lab which showed 100% stenosis of Dist-LAD. Successful PCI performed with thrombectomy and thrombus removal and PTCA.  Subjective   Reports having continued chest pain overnight but now resolved. Denies any shortness of breath or palpitations. No complications following his catheterization yesterday.  Inpatient Medications    . aspirin  81 mg Oral Daily  . atorvastatin  80 mg Oral q1800  . Influenza vac split quadrivalent PF  0.5 mL Intramuscular Tomorrow-1000  . metoprolol tartrate  25 mg Oral BID  . sodium chloride flush  3 mL Intravenous Q12H  . ticagrelor  90 mg Oral BID    Vital Signs    Filed Vitals:   12/19/15 0800 12/19/15 0900 12/19/15 0903 12/19/15 1000  BP: 122/79  122/79 136/88  Pulse: 71 73 85 75  Temp: 98.4 F (36.9 C)     TempSrc: Axillary     Resp: 0 21  24  Height:      Weight:      SpO2: 99% 100%  100%    Intake/Output Summary (Last 24 hours) at 12/19/15 1029 Last data filed at 12/19/15 1000  Gross per 24 hour  Intake 3384.11 ml  Output   1470 ml  Net 1914.11 ml   Filed Weights   12/18/15 1815  Weight: 216 lb 11.4 oz (98.3 kg)    Physical Exam    General: Well developed, well nourished, African American male appearing in no acute distress. Head: Normocephalic, atraumatic.  Neck: Supple without bruits, JVD not elevated. Lungs:  Resp regular and unlabored, CTA without wheezing or rales. Heart: RRR, S1, S2, no S3, S4, or murmur; no  rub. Abdomen: Soft, non-tender, non-distended with normoactive bowel sounds. No hepatomegaly. No rebound/guarding. No obvious abdominal masses. Extremities: No clubbing, cyanosis, or edema. Distal pedal pulses are 2+ bilaterally. Right radial cath site without ecchymosis or evidence of a hematoma. Neuro: Alert and oriented X 3. Moves all extremities spontaneously. Psych: Normal affect.  Labs    CBC  Recent Labs  12/18/15 1630 12/18/15 1632 12/19/15 0500  WBC 10.1  --  8.3  HGB 15.0 15.3 13.9  HCT 42.9 45.0 41.4  MCV 84.3  --  85.9  PLT 337  --  374   Basic Metabolic Panel  Recent Labs  12/18/15 1630 12/18/15 1632 12/18/15 1922 12/19/15 0500  NA 136 136  --  138  K 2.8* 2.7*  --  4.3  CL 104 102  --  107  CO2 20*  --   --  25  GLUCOSE 136* 135*  --  107*  BUN 9 12  --  6  CREATININE 1.05 0.80  --  0.86  CALCIUM 8.0*  --   --  7.6*  MG 1.3*  --  1.6* 2.2   Liver Function Tests  Recent Labs  12/18/15 1630  AST 32  ALT 22  ALKPHOS 41  BILITOT 0.7  PROT 4.2*  ALBUMIN 1.5*   No results for input(s): LIPASE, AMYLASE in the last  72 hours. Cardiac Enzymes  Recent Labs  12/18/15 1630 12/18/15 1922 12/19/15 0500  CKTOTAL 574*  --   --   CKMB 12.1*  --   --   TROPONINI 1.13* 3.49* 31.70*   BNP Invalid input(s): POCBNP D-Dimer No results for input(s): DDIMER in the last 72 hours. Hemoglobin A1C  Recent Labs  12/18/15 1630  HGBA1C 5.6   Fasting Lipid Panel  Recent Labs  12/18/15 1630  CHOL 464*  HDL 42  LDLCALC 356*  TRIG 330*  CHOLHDL 11.0   Thyroid Function Tests  Recent Labs  12/18/15 1630  TSH 1.551    Telemetry    NSR, HR in 70's - 80's. Multiple episodes of 4-5 beat NSVT.  ECG    NSR, HR 73, Up-sloping anterolateral ST, improved from previous tracings.   Cardiac Studies and Radiology    Cardiac Catheterization: 12/18/2015   Dist LAD lesion, 100% stenosed. Post intervention, there is a 0% residual stenosis.  There is  mild left ventricular systolic dysfunction.  Acute ST segment elevation myocardial infarction secondary to thrombotic occlusion of the distal (near apical) LAD.  Normal ramus intermediate, left circumflex, and RCA vessels.  Low-normal acute LV dysfunction with an ejection fraction of 50% with focal area of apical hypocontractility.  Successful PCI to the apical LAD occlusion treated with successful thrombectomy with significant thrombus removal and PTCA with the 100% occlusion being reduced to 0% and restoration of brisk TIMI-3 flow.  RECOMMENDATION: The patient was started on Brilinta in the acute coronary syndrome setting should be maintained with baby aspirin in addition to medical therapy for significant hypertension including beta blocker and ACE/ARB inhibition as well as lipid-lowering therapy.   Assessment & Plan    1. STEMI involving the LAD - developed chest pain on 12/18/2015 while using a pressure washer. EMS was called and EKG showed ST elevation in the inferior and lateral leads with CODE STEMI being activated. - troponin values peaked at 31.70. Will recheck in the AM. - cath showed 100% stenosis of the Dist LAD. Successful PCI to the apical LAD occlusion was treated with successful thrombectomy with significant thrombus removal and PTCA. Started on DAPT with ASA and Brilinta. - continue BB and high-intensity statin. Continue along STEMI pathway. Will ambulate with cardiac rehab later today.  2. HLD - LDL elevated to 356. Total Cholesterol 464. Triglycerides 330. - started on high-intensity statin  3. Hypokalemia - 2.8 on admission.  - replaced. Now at 4.3 on 12/19/2015.  4. Hypertension - BP improved since admission with recent SBP in the 110's - 130's.  - continue BB. If BP increases, would consider addition of low-dose ACE-I.   Lorri FrederickSigned, Brittany M Strader , PA-C 10:29 AM 12/19/2015 Pager: [phone]  Patient seen, examined. Available data reviewed. Agree with  findings, assessment, and plan as outlined by Randall AnBrittany Strader, PA-C. The patient is doing well with some mild pleuritic discomfort but otherwise no symptoms. He is about to walk with cardiac rehabilitation. His chart is reviewed and we had a lengthy discussion about lifestyle modification, dietary modification, and the importance of medical therapy. His cholesterol is off the charts with an LDL of 356. He will need a high intensity statin drug for starters and I suspect he will require multidrug therapy. Will continue with medical therapy which should include a beta blocker and ACE inhibitor. He understands the need for dual antiplatelet therapy for at least 12 months. He understands that he will need lifetime medications to control blood pressure,  cholesterol, and reduce cardiovascular risk of recurrent MI. I think he is stable to transfer to a 3 W. bed today.  Tonny Bollman, M.D. 12/19/2015 11:43 AM

## 2015-12-19 NOTE — Care Management Note (Addendum)
Case Management Note  Patient Details  Name: Justin Valdez MRN: [id number] Date of Birth: 02/21/1986  Subjective/Objective:      Adm w mi              Action/Plan: lives w fam, gave pt 30day free and copay card for brilinta. Has bcbs ins.   Expected Discharge Date:                  Expected Discharge Plan:  Home/Self Care  In-House Referral:     Discharge planning Services  CM Consult, Medication Assistance  Post Acute Care Choice:    Choice offered to:     DME Arranged:    DME Agency:     HH Arranged:    HH Agency:     Status of Service:     Medicare Important Message Given:    Date Medicare IM Given:    Medicare IM give by:    Date Additional Medicare IM Given:    Additional Medicare Important Message give by:     If discussed at Long Length of Stay Meetings, dates discussed:    Additional Comments: ur review done. Per rep at [employer]:  30 day retail- $60/no auth required // 90 day retail or mail order- $180/ no auth required   Patient can continue to use [facility]   Hanley HaysDowell, Akeelah Seppala T, RN 12/19/2015, 9:37 AM

## 2015-12-20 ENCOUNTER — Inpatient Hospital Stay (HOSPITAL_COMMUNITY): Payer: BLUE CROSS/BLUE SHIELD

## 2015-12-20 ENCOUNTER — Encounter (HOSPITAL_COMMUNITY): Payer: Self-pay | Admitting: Student

## 2015-12-20 DIAGNOSIS — M79662 Pain in left lower leg: Secondary | ICD-10-CM

## 2015-12-20 DIAGNOSIS — M79661 Pain in right lower leg: Secondary | ICD-10-CM

## 2015-12-20 LAB — TROPONIN I: Troponin I: 17.82 ng/mL (ref <0.031)

## 2015-12-20 MED ORDER — ATORVASTATIN CALCIUM 80 MG PO TABS: 80.0000 mg | ORAL_TABLET | Freq: Every day | ORAL | Status: DC

## 2015-12-20 MED ORDER — METOPROLOL TARTRATE 25 MG PO TABS: 25.0000 mg | ORAL_TABLET | Freq: Two times a day (BID) | ORAL | Status: DC

## 2015-12-20 MED ORDER — TICAGRELOR 90 MG PO TABS: 90.0000 mg | ORAL_TABLET | Freq: Two times a day (BID) | ORAL | Status: DC

## 2015-12-20 MED ORDER — LOSARTAN POTASSIUM 25 MG PO TABS
25.0000 mg | ORAL_TABLET | Freq: Every day | ORAL | Status: AC
Start: 2015-12-20 — End: ?

## 2015-12-20 MED ORDER — ASPIRIN 81 MG PO CHEW
81.0000 mg | CHEWABLE_TABLET | Freq: Every day | ORAL | Status: AC
Start: 2015-12-20 — End: ?

## 2015-12-20 MED ORDER — NITROGLYCERIN 0.4 MG SL SUBL: 0.4000 mg | SUBLINGUAL_TABLET | SUBLINGUAL | Status: AC | PRN

## 2015-12-20 MED ORDER — TRAMADOL HCL 50 MG PO TABS
50.0000 mg | ORAL_TABLET | Freq: Four times a day (QID) | ORAL | Status: DC | PRN
  Administered 2015-12-20: 50 mg via ORAL
  Filled 2015-12-20: qty 1

## 2015-12-20 NOTE — Discharge Summary (Signed)
Discharge Summary    Patient ID: Justin Valdez,  MRN: [id number], DOB/AGE: 32/01/1986 32 y.o.  Admit date: 12/18/2015 Discharge date: 12/20/2015  Primary Care Provider: Pcp Not In System Primary Cardiologist: New - Dr. Tresa EndoKelly  Discharge Diagnoses    Active Problems:   ST elevation myocardial infarction involving left anterior descending (LAD) coronary artery (HCC)   ST elevation (STEMI) myocardial infarction involving left anterior descending coronary artery (HCC)   HLD (hyperlipidemia)   Hypokalemia   History of Present Illness     Justin Valdez is a 30 y.o. male with no significant PMH who developed chest pain on 12/18/2015 and EMS was called with his initial EKG showing inferior and lateral ST elevation. CODE STEMI was activated and he was taken emergently to the cardiac catheterization lab.   Hospital Course     Consultants: None  His catheterization showed 100% stenosis of the Dist LAD. The LAD occlusion was treated with successful thrombectomy with significant thrombus removal and PTCA with the 100% occlusion being reduced to 0% and restoration of brisk TIMI-3 flow. The full report is included below. No complications were noted following the procedure. He was stared on DAPT with ASA and Brilinta along with a BB and high-intensity statin.   Overnight, he continued to have episodes of chest pain and several episodes of 4-5 beat runs of NSVT were noted. The following morning, he denied any repeat episodes of chest pain. His troponin values peaked at 31.70. His Lipid Panel showed LDL elevated to 356, Total Cholesterol 464, and Triglycerides 330. He will continue with a high-intensity statin at this time but will likely require a multi-drug regimen. His BP remained elevated, therefore an ARB was added to his medication regimen. He ambulated over 250 ft with cardiac rehab without any anginal symptoms. He did report his calves were very tender and he noticed bilateral  swelling, therefore lower extremity dopplers were ordered to rule out a DVT.   On 12/20/2015, lower extremity dopplers were obtained and negative for DVT's. He was given PRN Tramadol to help with the cramping sensation so he could ambulate with cardiac rehab.  This was successfully performed with over 550 ft of ambulation. Extensive education was provided to the patient in regards to his heart attack and the importance of good medical adherence going forward.   A staff message was sent to arrange close 7-10 day TCM follow-up. He will remain out of work until he is seen at the time of his appointment. Prescriptions were provided for the medications listed below and he was provided a 30-day card in regards to his Brilinta.  Discharge Vitals Blood pressure 126/76, pulse 75, temperature 98 F (36.7 C), temperature source Oral, resp. rate 18, height 5\' 7"  (1.702 m), weight 218 lb 9.6 oz (99.156 kg), SpO2 100 %.  Filed Weights   12/18/15 1815 12/20/15 0500  Weight: 216 lb 11.4 oz (98.3 kg) 218 lb 9.6 oz (99.156 kg)    Labs & Radiologic Studies     CBC  Recent Labs  12/18/15 1630 12/18/15 1632 12/19/15 0500  WBC 10.1  --  8.3  HGB 15.0 15.3 13.9  HCT 42.9 45.0 41.4  MCV 84.3  --  85.9  PLT 337  --  374   Basic Metabolic Panel  Recent Labs  12/18/15 1630 12/18/15 1632 12/18/15 1922 12/19/15 0500  NA 136 136  --  138  K 2.8* 2.7*  --  4.3  CL 104 102  --  107  CO2 20*  --   --  25  GLUCOSE 136* 135*  --  107*  BUN 9 12  --  6  CREATININE 1.05 0.80  --  0.86  CALCIUM 8.0*  --   --  7.6*  MG 1.3*  --  1.6* 2.2   Liver Function Tests  Recent Labs  12/18/15 1630  AST 32  ALT 22  ALKPHOS 41  BILITOT 0.7  PROT 4.2*  ALBUMIN 1.5*   No results for input(s): LIPASE, AMYLASE in the last 72 hours. Cardiac Enzymes  Recent Labs  12/18/15 1630 12/18/15 1922 12/19/15 0500 12/20/15 0705  CKTOTAL 574*  --   --   --   CKMB 12.1*  --   --   --   TROPONINI 1.13* 3.49* 31.70*  17.82*   BNP Invalid input(s): POCBNP D-Dimer No results for input(s): DDIMER in the last 72 hours. Hemoglobin A1C  Recent Labs  12/18/15 1630  HGBA1C 5.6   Fasting Lipid Panel  Recent Labs  12/18/15 1630  CHOL 464*  HDL 42  LDLCALC 356*  TRIG 330*  CHOLHDL 11.0   Thyroid Function Tests  Recent Labs  12/18/15 1630  TSH 1.551    No results found.   Diagnostic Studies/Procedures     Cardiac Catheterization: 12/18/2015       Dist LAD lesion, 100% stenosed. Post intervention, there is a 0% residual stenosis.  There is mild left ventricular systolic dysfunction.  Acute ST segment elevation myocardial infarction secondary to thrombotic occlusion of the distal (near apical) LAD.  Normal ramus intermediate, left circumflex, and RCA vessels.  Low-normal acute LV dysfunction with an ejection fraction of 50% with focal area of apical hypocontractility.  Successful PCI to the apical LAD occlusion treated with successful thrombectomy with significant thrombus removal and PTCA with the 100% occlusion being reduced to 0% and restoration of brisk TIMI-3 flow.  RECOMMENDATION: The patient was started on Brilinta in the acute coronary syndrome setting should be maintained with baby aspirin in addition to medical therapy for significant hypertension including beta blocker and ACE/ARB inhibition as well as lipid-lowering therapy.   Disposition   Pt is being discharged home today in good condition.  Follow-up Plans & Appointments    Follow-up Information    Follow up with [facility].   Specialty:  Cardiology   Why:  The office will call you to arrange Cardiology Follow-up. If you do not hear from them by next Tuesday, please call the number above. Your appointment may be at the address above or at the [facility] location (they will tell you when they call).   Contact information:   [address] [place]  [address] [phone]     Discharge Instructions    Amb Referral to Cardiac Rehabilitation    Complete by:  As directed   Diagnosis:   Myocardial Infarction PCI       Diet - low sodium heart healthy    Complete by:  As directed      Discharge instructions    Complete by:  As directed   .bms  PLEASE REMEMBER TO BRING ALL OF YOUR MEDICATIONS TO EACH OF YOUR FOLLOW-UP OFFICE VISITS.  PLEASE ATTEND ALL SCHEDULED FOLLOW-UP APPOINTMENTS.   Activity: Increase activity slowly as tolerated. You may shower, but no soaking baths (or swimming) for 1 week. You may not return to work until cleared by your cardiologist. No lifting over 10 lbs  for 4 weeks. No sexual activity for 4 weeks.   Wound Care: You may wash cath site gently with soap and water. Keep cath site clean and dry. If you notice pain, swelling, bleeding or pus at your cath site, please call [phone].     Increase activity slowly    Complete by:  As directed            Discharge Medications   Current Discharge Medication List    START taking these medications   Details  aspirin 81 MG chewable tablet Chew 1 tablet (81 mg total) by mouth daily.    atorvastatin (LIPITOR) 80 MG tablet Take 1 tablet (80 mg total) by mouth daily at 6 PM. Qty: 30 tablet, Refills: 6    losartan (COZAAR) 25 MG tablet Take 1 tablet (25 mg total) by mouth daily. Qty: 30 tablet, Refills: 6    metoprolol tartrate (LOPRESSOR) 25 MG tablet Take 1 tablet (25 mg total) by mouth 2 (two) times daily. Qty: 60 tablet, Refills: 6    nitroGLYCERIN (NITROSTAT) 0.4 MG SL tablet Place 1 tablet (0.4 mg total) under the tongue every 5 (five) minutes as needed for chest pain. Qty: 25 tablet, Refills: 2    ticagrelor (BRILINTA) 90 MG TABS tablet Take 1 tablet (90 mg total) by mouth 2 (two) times daily. Qty: 30 tablet, Refills: 9      CONTINUE these medications which have NOT CHANGED   Details  ondansetron (ZOFRAN) 4 MG tablet Take 1 tablet (4 mg total) by  mouth every 6 (six) hours. Qty: 12 tablet, Refills: 0         Aspirin prescribed at discharge?  Yes High Intensity Statin Prescribed? (Lipitor 40-80mg  or Crestor 20-40mg ): Yes Beta Blocker Prescribed? Yes For EF 45% or less, Was ACEI/ARB Prescribed? Yes ADP Receptor Inhibitor Prescribed? (i.e. Plavix etc.-Includes Medically Managed Patients): Yes For EF <40%, Aldosterone Inhibitor Prescribed? No: EF > 40%. Was EF assessed during THIS hospitalization? Yes - Cardiac Catheterization Was Cardiac Rehab II ordered? (Included Medically managed Patients): Yes   Allergies No Known Allergies   Outstanding Labs/Studies   None  Duration of Discharge Encounter   Greater than 30 minutes including physician time.  Signed, Ellsworth Lennox, PA-C 12/20/2015, 5:36 PM

## 2015-12-20 NOTE — Progress Notes (Signed)
VASCULAR LAB PRELIMINARY  PRELIMINARY  PRELIMINARY  PRELIMINARY  Bilateral lower extremity venous duplex completed.    Preliminary report:  Bilateral:  No evidence of DVT, superficial thrombosis, or Baker's Cyst.   Aylanie Cubillos, RVS 12/20/2015, 11:46 AM

## 2015-12-20 NOTE — Progress Notes (Signed)
CARDIAC REHAB PHASE I   PRE:  Rate/Rhythm: 67 SR    BP: sitting 91/60    SaO2:   MODE:  Ambulation: 550 ft   POST:  Rate/Rhythm: 75 SR    BP: sitting 131/79     SaO2:   Pt tolerated well. Sts his calves are still sore but not as bad. Increased distance without c/o. Ed completed with pt, mother, and sister. Good reception although he is overwhelmed. Will send referral to [place]. [id number]  Harriet MassonRandi Kristan Lore Polka CES, ACSM 12/20/2015 2:29 PM

## 2015-12-20 NOTE — Progress Notes (Signed)
Hospital Problem List     Active Problems:   ST elevation myocardial infarction involving left anterior descending (LAD) coronary artery (HCC)   ST elevation (STEMI) myocardial infarction involving left anterior descending coronary artery (HCC)   HLD (hyperlipidemia)   Hypokalemia    Patient Profile:   Primary Cardiologist: New - Dr. Tresa EndoKelly  30 yo male w/ no significant PMH who developed chest pain on 12/18/2015 and EMS was called and showed an inferior and lateral ST elevation. Taken emergently to the catheterization lab which showed 100% stenosis of Dist-LAD. Successful PCI performed with thrombectomy and thrombus removal and PTCA.  Subjective   The patient was initially alarmed when I entered the room, for he said another provider had stopped by and said he would be going to Inpatient Rehabilitation on the 4th floor. I informed the patient this provider clearly had the wrong patient and he will be going home from the telemetry floor when appropriate.  He reports having bilateral lower leg pain, tender to touch and with swelling. Says the swelling has been present since 08/2015, but is now worse. Denies any chest pain or palpitations.  Inpatient Medications    . aspirin  81 mg Oral Daily  . atorvastatin  80 mg Oral q1800  . Influenza vac split quadrivalent PF  0.5 mL Intramuscular Tomorrow-1000  . losartan  25 mg Oral Daily  . metoprolol tartrate  25 mg Oral BID  . ticagrelor  90 mg Oral BID    Vital Signs    Filed Vitals:   12/19/15 1600 12/19/15 1755 12/19/15 1953 12/20/15 0500  BP: 139/82 155/82 150/63 126/76  Pulse:  100 77 75  Temp:  98.9 F (37.2 C) 98.2 F (36.8 C) 98 F (36.7 C)  TempSrc:  Oral Oral Oral  Resp: 15 20 18 18   Height:      Weight:    218 lb 9.6 oz (99.156 kg)  SpO2:  100% 100% 100%    Intake/Output Summary (Last 24 hours) at 12/20/15 1102 Last data filed at 12/20/15 0500  Gross per 24 hour  Intake  755.4 ml  Output    350 ml  Net  405.4  ml   Filed Weights   12/18/15 1815 12/20/15 0500  Weight: 216 lb 11.4 oz (98.3 kg) 218 lb 9.6 oz (99.156 kg)    Physical Exam    General: Well developed, well nourished, African American male appearing in no acute distress. Head: Normocephalic, atraumatic.  Neck: Supple without bruits, JVD not elevated. Lungs:  Resp regular and unlabored, CTA without wheezing or rales. Heart: RRR, S1, S2, no S3, S4, or murmur; no rub. Abdomen: Soft, non-tender, non-distended with normoactive bowel sounds. No hepatomegaly. No rebound/guarding. No obvious abdominal masses. Extremities: No clubbing, cyanosis, or edema. Distal pedal pulses are 2+ bilaterally. Right radial cath site without ecchymosis or evidence of a hematoma. Tender to palpation along both lower extremities. Mild swelling noted. Neuro: Alert and oriented X 3. Moves all extremities spontaneously. Psych: Normal affect.  Labs    CBC  Recent Labs  12/18/15 1630 12/18/15 1632 12/19/15 0500  WBC 10.1  --  8.3  HGB 15.0 15.3 13.9  HCT 42.9 45.0 41.4  MCV 84.3  --  85.9  PLT 337  --  374   Basic Metabolic Panel  Recent Labs  12/18/15 1630 12/18/15 1632 12/18/15 1922 12/19/15 0500  NA 136 136  --  138  K 2.8* 2.7*  --  4.3  CL 104  102  --  107  CO2 20*  --   --  25  GLUCOSE 136* 135*  --  107*  BUN 9 12  --  6  CREATININE 1.05 0.80  --  0.86  CALCIUM 8.0*  --   --  7.6*  MG 1.3*  --  1.6* 2.2   Liver Function Tests  Recent Labs  12/18/15 1630  AST 32  ALT 22  ALKPHOS 41  BILITOT 0.7  PROT 4.2*  ALBUMIN 1.5*   No results for input(s): LIPASE, AMYLASE in the last 72 hours. Cardiac Enzymes  Recent Labs  12/18/15 1630 12/18/15 1922 12/19/15 0500 12/20/15 0705  CKTOTAL 574*  --   --   --   CKMB 12.1*  --   --   --   TROPONINI 1.13* 3.49* 31.70* 17.82*   BNP Invalid input(s): POCBNP D-Dimer No results for input(s): DDIMER in the last 72 hours. Hemoglobin A1C  Recent Labs  12/18/15 1630  HGBA1C 5.6     Fasting Lipid Panel  Recent Labs  12/18/15 1630  CHOL 464*  HDL 42  LDLCALC 356*  TRIG 330*  CHOLHDL 11.0   Thyroid Function Tests  Recent Labs  12/18/15 1630  TSH 1.551    Telemetry    NSR, HR in 70's - 80's. Multiple episodes of 4-5 beat NSVT.  ECG    NSR, HR 73, Up-sloping anterolateral ST, improved from previous tracings.   Cardiac Studies and Radiology    Cardiac Catheterization: 12/18/2015   Dist LAD lesion, 100% stenosed. Post intervention, there is a 0% residual stenosis.  There is mild left ventricular systolic dysfunction.  Acute ST segment elevation myocardial infarction secondary to thrombotic occlusion of the distal (near apical) LAD.  Normal ramus intermediate, left circumflex, and RCA vessels.  Low-normal acute LV dysfunction with an ejection fraction of 50% with focal area of apical hypocontractility.  Successful PCI to the apical LAD occlusion treated with successful thrombectomy with significant thrombus removal and PTCA with the 100% occlusion being reduced to 0% and restoration of brisk TIMI-3 flow.  RECOMMENDATION: The patient was started on Brilinta in the acute coronary syndrome setting should be maintained with baby aspirin in addition to medical therapy for significant hypertension including beta blocker and ACE/ARB inhibition as well as lipid-lowering therapy.   Assessment & Plan    1. STEMI involving the LAD - developed chest pain on 12/18/2015 while using a pressure washer. EMS was called and EKG showed ST elevation in the inferior and lateral leads with CODE STEMI being activated. - troponin values peaked at 31.70. Trending down to 17.82 on 12/20/2015. - cath showed 100% stenosis of the Dist LAD. Successful PCI to the apical LAD occlusion was treated with successful thrombectomy with significant thrombus removal and PTCA. Started on DAPT with ASA and Brilinta. - continue BB, high-intensity statin, and ARB. Continue along STEMI  pathway. Will ambulate with cardiac rehab later today once lower extremity dopplers are performed.  2. HLD - LDL elevated to 356. Total Cholesterol 464. Triglycerides 330. - started on high-intensity statin  3. Hypokalemia - 2.8 on admission.  - replaced. Now at 4.3 on 12/19/2015. Will obtain repeat BMET.  4. Hypertension - stable - continue current medication regimen.  5. Lower Extremity Swelling - for Lower Extremity Doppler studies today to rule out DVT. - says the pain feels like a cramping sensation. Will add PRN Tramadol to help with the pain so he can ambulate with cardiac rehab once ruled  out for DVT.     Tonny Bollman, M.D. 12/20/2015 11:02 AM  Patient seen, examined. Available data reviewed. Agree with findings, assessment, and plan as outlined by Randall An, PA-C. Exam reveals an alert, oriented male in no distress. Lungs are clear. Heart is regular rate and rhythm. There is no peripheral edema. DVT study is negative bilaterally. The patient is stable for discharge. We reviewed postdischarge instructions at length. He can return to work after he has outpatient follow-up in 1-2 weeks. He understands the importance of medication appearance.  Tonny Bollman, M.D. 12/20/2015 4:32 PM

## 2015-12-23 ENCOUNTER — Telehealth: Payer: Self-pay | Admitting: Cardiovascular Disease

## 2015-12-23 NOTE — Telephone Encounter (Signed)
Pt has 7-10 day TOC fu appt from 12-20-15, [facility]

## 2015-12-23 NOTE — Telephone Encounter (Signed)
TOC call to patient.He stated he is doing much better.He understands discharge instructions and is taking medications as prescribed.Advised to keep post hospital appointment with Cline CrockKathryn Thompson PA 12/[facility]/15 [facility]

## 2015-12-26 NOTE — Progress Notes (Addendum)
Cardiology Office Note    Date:  12/27/2015   ID:  Justin Valdez, DOB 11/12/1985, MRN [id number]  PCP:  Pcp Not In System  Cardiologist:  Dr. Tresa EndoKelly   Post hospital follow up  History of Present Illness:  Justin Valdez is a 30 y.o. male  with no significant PMH who was recently admitted for inferolateral STEMI s/p thrombectomy and PCTA who presents to clinic for follow up.   He developed chest pain on 12/18/2015 and EMS was called with his initial EKG showing inferior and lateral ST elevation. His catheterization showed 100% stenosis of the Dist LAD. The LAD occlusion was treated with successful thrombectomy with significant thrombus removal and PTCA with the 100% occlusion being reduced to 0% and restoration of brisk TIMI-3 flow. He was stared on DAPT with ASA and Brilinta along with a BB and high-intensity statin.  Overnight, he continued to have episodes of chest pain and several episodes of 4-5 beat runs of NSVT were noted. The following morning, he denied any repeat episodes of chest pain. His troponin values peaked at 31.70. His Lipid Panel showed LDL elevated to 356, Total Cholesterol 464, and Triglycerides 330. He will continue with a high-intensity statin at this time but will likely require a multi-drug regimen. His BP remained elevated, therefore an ARB was added to his medication regimen. He did report his calves were very tender and he noticed bilateral swelling, but lower extremity dopplers negative for DVT. He was given PRN Tramadol to help with the cramping sensation so he could ambulate with cardiac rehab. This was successfully performed with over 550 ft of ambulation. Extensive education was provided to the patient in regards to his heart attack and the importance of good medical adherence going forward.   Today he presents to clinic for follow-up. He has been doing his walking and doing some light workouts with no chest pain. He has been working on his diet and  watching his salt. No LE edema, orthopnea or PND No dizziness or syncope. He is taking all of his medications as prescribed.     Past Medical History  Diagnosis Date  . Myocardial infarction (HCC)     a. STEMI: 100% dist-LAD stenosis treated w/ thrombectomy and PTCA  . HLD (hyperlipidemia)   . Benign essential HTN     Past Surgical History  Procedure Laterality Date  . Skin graft    . Skin graft    . Cardiac catheterization N/A 12/18/2015    Procedure: Left Heart Cath and Coronary Angiography;  Surgeon: Lennette Biharihomas A Kelly, MD;  [facility];  Service: Cardiovascular;  Laterality: N/A;  . Cardiac catheterization N/A 12/18/2015    Procedure: Coronary Balloon Angioplasty;  Surgeon: Lennette Biharihomas A Kelly, MD;  Location: [facility] INVASIVE CV LAB;  Service: Cardiovascular;  Laterality: N/A;  LAD/atherectomy  . Peripheral vascular catheterization  12/18/2015    Procedure: Thrombectomy;  Surgeon: Lennette Biharihomas A Kelly, MD;  [facility];  Service: Cardiovascular;;    Current Medications: Outpatient Prescriptions Prior to Visit  Medication Sig Dispense Refill  . aspirin 81 MG chewable tablet Chew 1 tablet (81 mg total) by mouth daily.    Marland Kitchen. atorvastatin (LIPITOR) 80 MG tablet Take 1 tablet (80 mg total) by mouth daily at 6 PM. 30 tablet 6  . losartan (COZAAR) 25 MG tablet Take 1 tablet (25 mg total) by mouth daily. 30 tablet 6  . metoprolol tartrate (LOPRESSOR) 25 MG tablet Take 1 tablet (25  mg total) by mouth 2 (two) times daily. 60 tablet 6  . nitroGLYCERIN (NITROSTAT) 0.4 MG SL tablet Place 1 tablet (0.4 mg total) under the tongue every 5 (five) minutes as needed for chest pain. 25 tablet 2  . ticagrelor (BRILINTA) 90 MG TABS tablet Take 1 tablet (90 mg total) by mouth 2 (two) times daily. 30 tablet 9  . ondansetron (ZOFRAN) 4 MG tablet Take 1 tablet (4 mg total) by mouth every 6 (six) hours. (Patient not taking: Reported on 12/19/2015) 12 tablet 0   No facility-administered  medications prior to visit.     Allergies:   Review of patient's allergies indicates no known allergies.   Social History   Social History  . Marital Status: Married    Spouse Name: N/A  . Number of Children: N/A  . Years of Education: N/A   Social History Main Topics  . Smoking status: Never Smoker   . Smokeless tobacco: Former Neurosurgeon  . Alcohol Use: Yes     Comment: Occassional Use  . Drug Use: No  . Sexual Activity: Yes   Other Topics Concern  . None   Social History Narrative     Family History:  Mother has HTN. Father has no heath problems. His twin sister had a TIA.  ROS:   Please see the history of present illness.    ROS All other systems reviewed and are negative.   PHYSICAL EXAM:   VS:  BP 102/68 mmHg  Pulse 56  Ht  (1.702 m)  Wt 213 lb 3.2 oz (96.707 kg)  BMI 33.38 kg/m2   GEN: Well nourished, well developed, in no acute distress HEENT: normal Neck: no JVD, carotid bruits, or masses Cardiac: RRR; no murmurs, rubs, or gallops,no edema  Respiratory:  clear to auscultation bilaterally, normal work of breathing GI: soft, nontender, nondistended, + BS MS: no deformity or atrophy Skin: warm and dry, no rash Neuro:  Alert and Oriented x 3, Strength and sensation are intact Psych: euthymic mood, full affect  Wt Readings from Last 3 Encounters:  12/27/15 213 lb 3.2 oz (96.707 kg)  12/20/15 218 lb 9.6 oz (99.156 kg)  09/08/15 235 lb 6.4 oz (106.777 kg)      Studies/Labs Reviewed:   EKG:  EKG is ordered today.  The ekg ordered today demonstrates sinus bradycardia, HR 56 anterior Q waves.   Recent Labs: 12/18/2015: ALT 22; TSH 1.551 12/19/2015: BUN 6; Creatinine, Ser 0.86; Hemoglobin 13.9; Magnesium 2.2; Platelets 374; Potassium 4.3; Sodium 138   Lipid Panel    Component Value Date/Time   CHOL 464* 12/18/2015 1630   TRIG 330* 12/18/2015 1630   HDL 42 12/18/2015 1630   CHOLHDL 11.0 12/18/2015 1630   VLDL 66* 12/18/2015 1630   LDLCALC 356*  12/18/2015 1630    Additional studies/ records that were reviewed today include:  Cardiac Catheterization: 12/18/2015       Dist LAD lesion, 100% stenosed. Post intervention, there is a 0% residual stenosis.  There is mild left ventricular systolic dysfunction.  Acute ST segment elevation myocardial infarction secondary to thrombotic occlusion of the distal (near apical) LAD. Normal ramus intermediate, left circumflex, and RCA vessels. Low-normal acute LV dysfunction with an ejection fraction of 50% with focal area of apical hypocontractility. Successful PCI to the apical LAD occlusion treated with successful thrombectomy with significant thrombus removal and PTCA with the 100% occlusion being reduced to 0% and restoration of brisk TIMI-3 flow. RECOMMENDATION: The patient was started on Brilinta  in the acute coronary syndrome setting should be maintained with baby aspirin in addition to medical therapy for significant hypertension including beta blocker and ACE/ARB inhibition as well as lipid-lowering therapy.     ASSESSMENT:    1. Coronary artery disease involving native coronary artery of native heart without angina pectoris   2. Familial hyperlipidemia   3. Essential hypertension      PLAN:  In order of problems listed above:  1. CAD: LHC showed 100% stenosis of the Dist LAD s/p successful thrombectomy with significant thrombus removal and PTCA  -- Continue DAPT with ASA and Brilinta along with a BB and high-intensity statin.   2. Probable familial HLD: Lipid Panel showed LDL elevated to 356, Total Cholesterol 464, and Triglycerides 330. -- Continue atorvastatin  daily. We will recheck a lipid panel in 8 weeks (first week in May). If LDL still not at goal ( probably will not be) I will refer to the lipid clinic. He may need the addition of Ezetimibe or an injectible PCSK 9 inhibitor.   3. HTN: BP 102/68. Continue losartan  daily and Lopressor  BID.    Medication Adjustments/Labs and Tests Ordered: Current medicines are reviewed at length with the patient today.  Concerns regarding medicines are outlined above.  Medication changes, Labs and Tests ordered today are listed in the Patient Instructions below. Patient Instructions  Medication Instructions:  Your physician recommends that you continue on your current medications as directed. Please refer to the Current Medication list given to you today.   Labwork: 1ST WEEK OF MAY:  Your physician recommends that you return for a FASTING lipid profile   Testing/Procedures: None ordered  Follow-Up: Your physician recommends that you schedule a follow-up appointment in: 3 MONTHS WITH DR. Tresa Endo   Any Other Special Instructions Will Be Listed Below (If Applicable).   If you need a refill on your cardiac medications before your next appointment, please call your pharmacy.       Charlestine Massed  12/27/2015 10:38 AM    [facility] [address] [place], [place], [place]  [address] Phone: [phone]; Fax: [phone]

## 2015-12-27 ENCOUNTER — Ambulatory Visit (INDEPENDENT_AMBULATORY_CARE_PROVIDER_SITE_OTHER): Payer: BLUE CROSS/BLUE SHIELD | Admitting: Physician Assistant

## 2015-12-27 ENCOUNTER — Encounter: Payer: Self-pay | Admitting: Physician Assistant

## 2015-12-27 ENCOUNTER — Encounter: Payer: Self-pay | Admitting: *Deleted

## 2015-12-27 VITALS — BP 102/68 | HR 56 | Ht 67.0 in | Wt 213.2 lb

## 2015-12-27 DIAGNOSIS — E785 Hyperlipidemia, unspecified: Secondary | ICD-10-CM | POA: Diagnosis not present

## 2015-12-27 DIAGNOSIS — I2510 Atherosclerotic heart disease of native coronary artery without angina pectoris: Secondary | ICD-10-CM | POA: Diagnosis not present

## 2015-12-27 DIAGNOSIS — E7849 Other hyperlipidemia: Secondary | ICD-10-CM

## 2015-12-27 DIAGNOSIS — I10 Essential (primary) hypertension: Secondary | ICD-10-CM

## 2015-12-27 NOTE — Patient Instructions (Signed)
Medication Instructions:  Your physician recommends that you continue on your current medications as directed. Please refer to the Current Medication list given to you today.   Labwork: 1ST WEEK OF MAY:  Your physician recommends that you return for a FASTING lipid profile   Testing/Procedures: None ordered  Follow-Up: Your physician recommends that you schedule a follow-up appointment in: 3 MONTHS WITH DR. Tresa EndoKELLY   Any Other Special Instructions Will Be Listed Below (If Applicable).   If you need a refill on your cardiac medications before your next appointment, please call your pharmacy.

## 2016-01-06 ENCOUNTER — Emergency Department (HOSPITAL_COMMUNITY): Payer: BLUE CROSS/BLUE SHIELD

## 2016-01-06 ENCOUNTER — Emergency Department (HOSPITAL_COMMUNITY)
Admission: EM | Admit: 2016-01-06 | Discharge: 2016-01-06 | Disposition: A | Discharge status: Home / Self Care | Payer: BLUE CROSS/BLUE SHIELD | Attending: Emergency Medicine | Admitting: Emergency Medicine

## 2016-01-06 ENCOUNTER — Encounter (HOSPITAL_COMMUNITY): Payer: Self-pay | Admitting: *Deleted

## 2016-01-06 DIAGNOSIS — I10 Essential (primary) hypertension: Secondary | ICD-10-CM | POA: Insufficient documentation

## 2016-01-06 DIAGNOSIS — Z79899 Other long term (current) drug therapy: Secondary | ICD-10-CM | POA: Insufficient documentation

## 2016-01-06 DIAGNOSIS — R609 Edema, unspecified: Secondary | ICD-10-CM | POA: Insufficient documentation

## 2016-01-06 DIAGNOSIS — Z7982 Long term (current) use of aspirin: Secondary | ICD-10-CM | POA: Diagnosis not present

## 2016-01-06 DIAGNOSIS — Z9889 Other specified postprocedural states: Secondary | ICD-10-CM | POA: Insufficient documentation

## 2016-01-06 DIAGNOSIS — E785 Hyperlipidemia, unspecified: Secondary | ICD-10-CM | POA: Insufficient documentation

## 2016-01-06 DIAGNOSIS — I252 Old myocardial infarction: Secondary | ICD-10-CM | POA: Diagnosis not present

## 2016-01-06 DIAGNOSIS — M7989 Other specified soft tissue disorders: Secondary | ICD-10-CM | POA: Diagnosis not present

## 2016-01-06 LAB — CBC WITH DIFFERENTIAL/PLATELET
BASOS PCT: 0 %
Basophils Absolute: 0 10*3/uL (ref 0.0–0.1)
EOS ABS: 0.5 10*3/uL (ref 0.0–0.7)
Eosinophils Relative: 7 %
HEMATOCRIT: 39.4 % (ref 39.0–52.0)
HEMOGLOBIN: 13.3 g/dL (ref 13.0–17.0)
LYMPHS ABS: 2.2 10*3/uL (ref 0.7–4.0)
Lymphocytes Relative: 35 %
MCH: 29.2 pg (ref 26.0–34.0)
MCHC: 33.8 g/dL (ref 30.0–36.0)
MCV: 86.4 fL (ref 78.0–100.0)
Monocytes Absolute: 0.4 10*3/uL (ref 0.1–1.0)
Monocytes Relative: 7 %
NEUTROS ABS: 3.2 10*3/uL (ref 1.7–7.7)
NEUTROS PCT: 51 %
Platelets: 243 10*3/uL (ref 150–400)
RBC: 4.56 MIL/uL (ref 4.22–5.81)
RDW: 12.7 % (ref 11.5–15.5)
WBC: 6.2 10*3/uL (ref 4.0–10.5)

## 2016-01-06 LAB — COMPREHENSIVE METABOLIC PANEL
ALBUMIN: 1.3 g/dL — AB (ref 3.5–5.0)
ALK PHOS: 49 U/L (ref 38–126)
ALT: 30 U/L (ref 17–63)
AST: 24 U/L (ref 15–41)
Anion gap: 7 (ref 5–15)
BILIRUBIN TOTAL: 0.5 mg/dL (ref 0.3–1.2)
BUN: 12 mg/dL (ref 6–20)
CALCIUM: 8.2 mg/dL — AB (ref 8.9–10.3)
CO2: 28 mmol/L (ref 22–32)
CREATININE: 1.04 mg/dL (ref 0.61–1.24)
Chloride: 106 mmol/L (ref 101–111)
GFR calc Af Amer: >60 mL/min (ref >60)
GFR calc non Af Amer: >60 mL/min (ref >60)
GLUCOSE: 92 mg/dL (ref 65–99)
Potassium: 4 mmol/L (ref 3.5–5.1)
SODIUM: 141 mmol/L (ref 135–145)
TOTAL PROTEIN: 3.7 g/dL — AB (ref 6.5–8.1)

## 2016-01-06 LAB — BRAIN NATRIURETIC PEPTIDE: B Natriuretic Peptide: 77.9 pg/mL (ref 0.0–100.0)

## 2016-01-06 MED ORDER — ENOXAPARIN SODIUM 120 MG/0.8ML ~~LOC~~ SOLN
1.0000 mg/kg | Freq: Once | SUBCUTANEOUS | Status: DC
  Filled 2016-01-06: qty 0.8

## 2016-01-06 MED ORDER — ENOXAPARIN SODIUM 100 MG/ML ~~LOC~~ SOLN
100.0000 mg | Freq: Once | SUBCUTANEOUS | Status: AC
  Administered 2016-01-06: 100 mg via SUBCUTANEOUS
  Filled 2016-01-06: qty 1

## 2016-01-06 NOTE — Discharge Instructions (Signed)
Edema °Edema is an abnormal buildup of fluids in your body tissues. Edema is somewhat dependent on gravity to pull the fluid to the lowest place in your body. That makes the condition more common in the legs and thighs (lower extremities). Painless swelling of the feet and ankles is common and becomes more likely as you get older. It is also common in looser tissues, like around your eyes.  °When the affected area is squeezed, the fluid may move out of that spot and leave a dent for a few moments. This dent is called pitting.  °CAUSES  °There are many possible causes of edema. Eating too much salt and being on your feet or sitting for a long time can cause edema in your legs and ankles. Hot weather may make edema worse. Common medical causes of edema include: °· Heart failure. °· Liver disease. °· Kidney disease. °· Weak blood vessels in your legs. °· Cancer. °· An injury. °· Pregnancy. °· Some medications. °· Obesity.  °SYMPTOMS  °Edema is usually painless. Your skin may look swollen or shiny.  °DIAGNOSIS  °Your health care provider may be able to diagnose edema by asking about your medical history and doing a physical exam. You may need to have tests such as X-rays, an electrocardiogram, or blood tests to check for medical conditions that may cause edema.  °TREATMENT  °Edema treatment depends on the cause. If you have heart, liver, or kidney disease, you need the treatment appropriate for these conditions. General treatment may include: °· Elevation of the affected body part above the level of your heart. °· Compression of the affected body part. Pressure from elastic bandages or support stockings squeezes the tissues and forces fluid back into the blood vessels. This keeps fluid from entering the tissues. °· Restriction of fluid and salt intake. °· Use of a water pill (diuretic). These medications are appropriate only for some types of edema. They pull fluid out of your body and make you urinate more often. This  gets rid of fluid and reduces swelling, but diuretics can have side effects. Only use diuretics as directed by your health care provider. °HOME CARE INSTRUCTIONS  °· Keep the affected body part above the level of your heart when you are lying down.   °· Do not sit still or stand for prolonged periods.   °· Do not put anything directly under your knees when lying down. °· Do not wear constricting clothing or garters on your upper legs.   °· Exercise your legs to work the fluid back into your blood vessels. This may help the swelling go down.   °· Wear elastic bandages or support stockings to reduce ankle swelling as directed by your health care provider.   °· Eat a low-salt diet to reduce fluid if your health care provider recommends it.   °· Only take medicines as directed by your health care provider.  °SEEK MEDICAL CARE IF:  °· Your edema is not responding to treatment. °· You have heart, liver, or kidney disease and notice symptoms of edema. °· You have edema in your legs that does not improve after elevating them.   °· You have sudden and unexplained weight gain. °SEEK IMMEDIATE MEDICAL CARE IF:  °· You develop shortness of breath or chest pain.   °· You cannot breathe when you lie down. °· You develop pain, redness, or warmth in the swollen areas.   °· You have heart, liver, or kidney disease and suddenly get edema. °· You have a fever and your symptoms suddenly get worse. °MAKE SURE YOU:  °·   Understand these instructions. °· Will watch your condition. °· Will get help right away if you are not doing well or get worse. °  °This information is not intended to replace advice given to you by your health care provider. Make sure you discuss any questions you have with your health care provider. °  °Document Released: 09/14/2005 Document Revised: 10/05/2014 Document Reviewed: 07/07/2013 °Elsevier Interactive Patient Education ©2016 [employer] Inc[employer] ° °

## 2016-01-06 NOTE — ED Provider Notes (Signed)
CSN: [id number]     Arrival date & time 01/06/16  1947 History   First MD Initiated Contact with Patient 01/06/16 2032     Chief Complaint  Patient presents with  . Leg Swelling     (Consider location/radiation/quality/duration/timing/severity/associated sxs/prior Treatment) Patient is a 30 y.o. male presenting with general illness. The history is provided by the patient.  Illness Severity:  Moderate Onset quality:  Sudden Duration:  1 week Timing:  Constant Progression:  Worsening Chronicity:  New Associated symptoms: no abdominal pain, no chest pain, no congestion, no diarrhea, no fever, no headaches, no myalgias, no rash, no shortness of breath and no vomiting    30 yo M With chief complaints of bilateral lower extremity swelling. Going on for about a week. Slowly worsening. Patient was in the hospital about a week and half ago for a STEMI. Patient had a distal LAD occlusion that had a stent placed. Patient denies any chest pain and shortness of breath. Denies any orthopnea or exertional dyspnea. Denies PND. He is concerned about a blood clot and he thinks that the cardiologist told him that a blood clot came from his leg can cause him to have an MI.  Past Medical History  Diagnosis Date  . Myocardial infarction (HCC)     a. STEMI: 100% dist-LAD stenosis treated w/ thrombectomy and PTCA  . HLD (hyperlipidemia)   . Benign essential HTN    Past Surgical History  Procedure Laterality Date  . Skin graft    . Skin graft    . Cardiac catheterization N/A 12/18/2015    Procedure: Left Heart Cath and Coronary Angiography;  Surgeon: Lennette Bihari, MD;  [facility] CV LAB;  Service: Cardiovascular;  Laterality: N/A;  . Cardiac catheterization N/A 12/18/2015    Procedure: Coronary Balloon Angioplasty;  Surgeon: Lennette Bihari, MD;  Location: [facility] INVASIVE CV LAB;  Service: Cardiovascular;  Laterality: N/A;  LAD/atherectomy  . Peripheral vascular catheterization  12/18/2015   Procedure: Thrombectomy;  Surgeon: Lennette Bihari, MD;  Location: [facility] INVASIVE CV LAB;  Service: Cardiovascular;;   History reviewed. No pertinent family history. Social History  Substance Use Topics  . Smoking status: Never Smoker   . Smokeless tobacco: Former Neurosurgeon  . Alcohol Use: Yes     Comment: Occassional Use    Review of Systems  Constitutional: Negative for fever and chills.  HENT: Negative for congestion and facial swelling.   Eyes: Negative for discharge and visual disturbance.  Respiratory: Negative for shortness of breath.   Cardiovascular: Negative for chest pain and palpitations.  Gastrointestinal: Negative for vomiting, abdominal pain and diarrhea.  Musculoskeletal: Negative for myalgias and arthralgias.  Skin: Negative for color change and rash.  Neurological: Negative for tremors, syncope and headaches.  Psychiatric/Behavioral: Negative for confusion and dysphoric mood.      Allergies  Review of patient's allergies indicates no known allergies.  Home Medications   Prior to Admission medications   Medication Sig Start Date End Date Taking? Authorizing Provider  aspirin 81 MG chewable tablet Chew 1 tablet (81 mg total) by mouth daily. 12/20/15  Yes Ellsworth Lennox, PA  atorvastatin (LIPITOR) 80 MG tablet Take 1 tablet (80 mg total) by mouth daily at 6 PM. 12/20/15  Yes Ellsworth Lennox, PA  losartan (COZAAR) 25 MG tablet Take 1 tablet (25 mg total) by mouth daily. 12/20/15  Yes Ellsworth Lennox, PA  metoprolol tartrate (LOPRESSOR) 25 MG tablet Take 1 tablet (25 mg total) by  mouth 2 (two) times daily. 12/20/15  Yes Ellsworth Lennox, PA  nitroGLYCERIN (NITROSTAT) 0.4 MG SL tablet Place 1 tablet (0.4 mg total) under the tongue every 5 (five) minutes as needed for chest pain. 12/20/15  Yes Ellsworth Lennox, PA  ondansetron (ZOFRAN) 4 MG tablet Take 4 mg by mouth every 8 (eight) hours as needed for nausea or vomiting.   Yes Historical Provider, MD  ticagrelor  (BRILINTA) 90 MG TABS tablet Take 1 tablet (90 mg total) by mouth 2 (two) times daily. 12/20/15  Yes Lowanda Foster M Strader, PA   BP 122/71 mmHg  Pulse 57  Temp(Src) 98.7 F (37.1 C) (Oral)  Resp 22  Ht  (1.676 m)  Wt 226 lb 4.8 oz (102.649 kg)  BMI 36.54 kg/m2  SpO2 97% Physical Exam  Constitutional: He is oriented to person, place, and time. He appears well-developed and well-nourished.  HENT:  Head: Normocephalic and atraumatic.  Eyes: EOM are normal. Pupils are equal, round, and reactive to light.  Neck: Normal range of motion. Neck supple. No JVD present.  Cardiovascular: Normal rate, regular rhythm and intact distal pulses.  Exam reveals no gallop and no friction rub.   No murmur heard. No s3  Pulmonary/Chest: No respiratory distress. He has no wheezes.  Abdominal: He exhibits no distension. There is no rebound and no guarding.  Musculoskeletal: Normal range of motion. He exhibits edema (2+ pitting R >L).  Neurological: He is alert and oriented to person, place, and time.  Skin: No rash noted. No pallor.  Psychiatric: He has a normal mood and affect. His behavior is normal.  Nursing note and vitals reviewed.   ED Course  Procedures (including critical care time) Labs Review Labs Reviewed  COMPREHENSIVE METABOLIC PANEL - Abnormal; Notable for the following:    Calcium 8.2 (*)    Total Protein 3.7 (*)    Albumin 1.3 (*)    All other components within normal limits  CBC WITH DIFFERENTIAL/PLATELET  BRAIN NATRIURETIC PEPTIDE    Imaging Review Dg Chest 2 View  01/06/2016  CLINICAL DATA:  Bilateral lower extremity swelling, worse on the right. EXAM: CHEST  2 VIEW COMPARISON:  None. FINDINGS: The heart size and mediastinal contours are within normal limits. Both lungs are clear. The visualized skeletal structures are unremarkable. IMPRESSION: No active cardiopulmonary disease. Electronically Signed   By: Ellery Plunk M.D.   On: 01/06/2016 21:14   I have personally  reviewed and evaluated these images and lab results as part of my medical decision-making.   EKG Interpretation   Date/Time:  Monday January 06 2016 20:31:01 EDT Ventricular Rate:  64 PR Interval:  169 QRS Duration: 94 QT Interval:  355 QTC Calculation: 366 R Axis:   80 Text Interpretation:  Sinus rhythm Probable anterior infarct, age  indeterminate new lateral flipped t waves Otherwise no significant change  Confirmed by Lenvil Swaim MD, DANIEL [phone]) on 01/06/2016 8:38:20 PM      MDM   Final diagnoses:  Leg swelling    25 y oM With a chief complaint of bilateral lower extremity swelling. There is a possibility of a DVT as the patient was recently hospitalized. I'm more concerned about the possibility of new onset heart failure with recent cath. BNP chest x-ray and EKG unremarkable. Patient was given a dose of Lovenox will have a bilateral DVT study tomorrow morning. Requested that he call his cardiologist and discuss lower extremity edema for possible further workup.  11:28 PM:  I have  discussed the diagnosis/risks/treatment options with the patient and believe the pt to be eligible for discharge home to follow-up with Cards. We also discussed returning to the ED immediately if new or worsening sx occur. We discussed the sx which are most concerning (e.g., sudden worsening pain, fever, inability to tolerate by mouth) that necessitate immediate return. Medications administered to the patient during their visit and any new prescriptions provided to the patient are listed below.  Medications given during this visit Medications  enoxaparin (LOVENOX) injection 100 mg (100 mg Subcutaneous Given 01/06/16 2304)    New Prescriptions   No medications on file    The patient appears reasonably screen and/or stabilized for discharge and I doubt any other medical condition or other [facility] requiring further screening, evaluation, or treatment in the ED at this time prior to discharge.      Melene Planan Yuliya Nova,  DO 01/06/16 2328

## 2016-01-06 NOTE — ED Notes (Signed)
Pt in c/o bilateral leg swelling, worse to right leg, pt was recently in the hospital after an MI that they believe was from a blood clot in his leg, since discharge patient has not been experiencing swelling about a week ago.

## 2016-01-06 NOTE — ED Notes (Signed)
MD at bedside. 

## 2016-01-07 ENCOUNTER — Ambulatory Visit (HOSPITAL_COMMUNITY)
Admission: RE | Admit: 2016-01-07 | Discharge: 2016-01-07 | Disposition: A | Discharge status: Home / Self Care | Payer: BLUE CROSS/BLUE SHIELD | Source: Ambulatory Visit | Attending: Emergency Medicine | Admitting: Emergency Medicine

## 2016-01-07 ENCOUNTER — Ambulatory Visit (HOSPITAL_COMMUNITY): Admission: RE | Admit: 2016-01-07 | Payer: BLUE CROSS/BLUE SHIELD | Source: Ambulatory Visit

## 2016-01-07 ENCOUNTER — Telehealth: Payer: Self-pay | Admitting: Cardiovascular Disease

## 2016-01-07 DIAGNOSIS — I10 Essential (primary) hypertension: Secondary | ICD-10-CM | POA: Diagnosis not present

## 2016-01-07 DIAGNOSIS — E785 Hyperlipidemia, unspecified: Secondary | ICD-10-CM | POA: Insufficient documentation

## 2016-01-07 DIAGNOSIS — R609 Edema, unspecified: Secondary | ICD-10-CM | POA: Diagnosis not present

## 2016-01-07 DIAGNOSIS — M7989 Other specified soft tissue disorders: Secondary | ICD-10-CM | POA: Insufficient documentation

## 2016-01-07 NOTE — Telephone Encounter (Signed)
Pt describes on and off leg swelling x ~4-5 months. He notes this started in December, had been more or less ongoing. [facility] - was prescribed HCTZ x3 days. Notes he got relief from leg swelling at that time.  He had a LE DVT r/o on 3/24 which was neg.  Still having ongoing leg swelling. Denies SOB at rest. States some SOB w/ activity. Pt denies CP presently.  Of note, [facility], admitted for inferolateral STEMI.  Had f/u w Carlean JewsKatie Thompson on 3/29, advised to f/u w/ Dr. Tresa EndoKelly in 3 months. He was started on Brilinta in hosp.  Will route to [facility] for advice.

## 2016-01-07 NOTE — Telephone Encounter (Signed)
Advice given, pt will observe and call if further problems/progression of swelling.  He has been scheduled for Dr. Tresa EndoKelly return visit in July, directions/address [place] [facility] given. Pt verbalized understanding of instructions and recommendations.

## 2016-01-07 NOTE — Progress Notes (Signed)
*  PRELIMINARY RESULTS* Vascular Ultrasound Lower extremity venous duplex has been completed.  Preliminary findings: No evidence of DVT or baker's cyst. Unchanged from 12/20/15 negative study.    Farrel DemarkJill Eunice, RDMS, RVT  01/07/2016, 3:23 PM

## 2016-01-07 NOTE — Telephone Encounter (Signed)
New Message  Pt c/o swelling: STAT is pt has developed SOB within 24 hours  1. How long have you been experiencing swelling? Originally started Sep 03 2015... Recently started about 6 days ago  2. Where is the swelling located? Both legs from feet to thighs  3.  Are you currently taking a "fluid pill"? No, he doesn't have any  4.  Are you currently SOB? Yes  5.  Have you traveled recently? No   Pt c/o Shortness Of Breath: STAT if SOB developed within the last 24 hours or pt is noticeably SOB on the phone  1. Are you currently SOB (can you hear that pt is SOB on the phone)? Yes ( just finished walking)  2. How long have you been experiencing SOB? 5 Minutes  3. Are you SOB when sitting or when up moving around? Moving around.. Not when he is sitting down  4.  Are you currently experiencing any other symptoms? No other symptoms

## 2016-01-07 NOTE — Telephone Encounter (Signed)
Not sure why he is having swelling. He was hospitalized in March and seen in follow up OV with no edema noted. Good LV function. On no meds that would cause swelling. Would make sure he is restricting salt intake and keep feet elevated when possible. Can follow up with Dr. Tresa EndoKelly as scheduled.  Peter SwazilandJordan MD, [facility]

## 2016-01-31 ENCOUNTER — Other Ambulatory Visit (INDEPENDENT_AMBULATORY_CARE_PROVIDER_SITE_OTHER): Payer: BLUE CROSS/BLUE SHIELD | Admitting: *Deleted

## 2016-01-31 DIAGNOSIS — E785 Hyperlipidemia, unspecified: Secondary | ICD-10-CM

## 2016-01-31 DIAGNOSIS — I2510 Atherosclerotic heart disease of native coronary artery without angina pectoris: Secondary | ICD-10-CM

## 2016-01-31 DIAGNOSIS — E7849 Other hyperlipidemia: Secondary | ICD-10-CM

## 2016-01-31 LAB — LIPID PANEL
Cholesterol: 310 mg/dL — ABNORMAL HIGH (ref 125–200)
HDL: 52 mg/dL (ref >=40)
LDL CALC: 229 mg/dL — AB (ref <130)
Total CHOL/HDL Ratio: 6 Ratio — ABNORMAL HIGH (ref <=5.0)
Triglycerides: 146 mg/dL (ref <150)
VLDL: 29 mg/dL (ref <30)

## 2016-01-31 NOTE — Addendum Note (Signed)
Addended by: Tonita PhoenixBOWDEN, Ardith Test K on: 01/31/2016 07:56 AM   Modules accepted: Orders

## 2016-02-04 ENCOUNTER — Telehealth: Payer: Self-pay | Admitting: Cardiovascular Disease

## 2016-02-04 ENCOUNTER — Telehealth: Payer: Self-pay | Admitting: *Deleted

## 2016-02-04 MED ORDER — EZETIMIBE 10 MG PO TABS: 10.0000 mg | ORAL_TABLET | Freq: Every day | ORAL | Status: DC

## 2016-02-04 NOTE — Telephone Encounter (Addendum)
Spoke to patient. Notes continued fatigue, swelling, SOB w walking up steps.  Thinks his metoprolol is causing these issues. He was put on several new meds recently though, so is not sure. Feels very poorly.  He had initially been given instruction that the symptoms he was having would get better, he reports no change. Pt agreeable to be seen in office for a medication management visit and/or investigation of problems.  In process of setting up extender visit.

## 2016-02-04 NOTE — Telephone Encounter (Signed)
Pt will not need a Lipid Clinic appt until we recheck his Lipid/Liver again in 3 months.

## 2016-02-04 NOTE — Telephone Encounter (Signed)
Spoke with pt and informed him of lab results and new orders. Pt verbalized understanding and was in agreement with this plan. Pt will set up appt with Lipid Clinic when he is here tomorrow to see Norma FredricksonLori Gerhardt, NP. Verified pharmacy and sent in prescription.

## 2016-02-04 NOTE — Telephone Encounter (Signed)
appt confirmed

## 2016-02-04 NOTE — Telephone Encounter (Signed)
Pt c/o swelling: STAT is pt has developed SOB within 24 hours  Pt wife calling to speak w/ RN concerning his swelling- stated that pt is now breaking w/ red bumps on his face/head/scalp. Please call back and discuss.   1. How long have you been experiencing swelling? About month  2. Where is the swelling located? Started in legs- then went to his stomach   3.  Are you currently taking a "fluid pill"? no  4.  Are you currently SOB? Little   5.  Have you traveled recently? No

## 2016-02-04 NOTE — Telephone Encounter (Signed)
-----   Message from Janetta HoraKathryn R Thompson, PA-C sent at 02/01/2016  7:19 AM EDT ----- Levels look a lot better but still extremely high. We need him to start Ezetimibe 10mg  daily. And then lets get him set up in the lipid clinic with Kennon RoundsSally. Thanks

## 2016-02-05 ENCOUNTER — Ambulatory Visit (INDEPENDENT_AMBULATORY_CARE_PROVIDER_SITE_OTHER): Payer: BLUE CROSS/BLUE SHIELD | Admitting: Nurse Practitioner

## 2016-02-05 ENCOUNTER — Other Ambulatory Visit: Payer: Self-pay | Admitting: *Deleted

## 2016-02-05 ENCOUNTER — Encounter: Payer: Self-pay | Admitting: Nurse Practitioner

## 2016-02-05 VITALS — BP 120/68 | HR 60 | Ht 66.0 in | Wt 232.8 lb

## 2016-02-05 DIAGNOSIS — E785 Hyperlipidemia, unspecified: Secondary | ICD-10-CM

## 2016-02-05 DIAGNOSIS — R609 Edema, unspecified: Secondary | ICD-10-CM

## 2016-02-05 DIAGNOSIS — I213 ST elevation (STEMI) myocardial infarction of unspecified site: Secondary | ICD-10-CM

## 2016-02-05 DIAGNOSIS — I240 Acute coronary thrombosis not resulting in myocardial infarction: Secondary | ICD-10-CM

## 2016-02-05 DIAGNOSIS — I10 Essential (primary) hypertension: Secondary | ICD-10-CM

## 2016-02-05 DIAGNOSIS — I2510 Atherosclerotic heart disease of native coronary artery without angina pectoris: Secondary | ICD-10-CM | POA: Diagnosis not present

## 2016-02-05 DIAGNOSIS — I519 Heart disease, unspecified: Secondary | ICD-10-CM

## 2016-02-05 MED ORDER — FUROSEMIDE 20 MG PO TABS: 40.0000 mg | ORAL_TABLET | Freq: Every day | ORAL | Status: AC

## 2016-02-05 MED ORDER — POTASSIUM CHLORIDE ER 10 MEQ PO TBCR: 10.0000 meq | EXTENDED_RELEASE_TABLET | Freq: Every day | ORAL | Status: AC

## 2016-02-05 NOTE — Patient Instructions (Addendum)
We will be sending [facility] to [facility] to get [facility] drawn - go today - [address]  Medication Instructions:    Continue with your current medicines. BUT  I am adding Lasix 40 mg a day with Potassium 10 meq daily - these are at the drug store    Testing/Procedures To Be Arranged:  Echocardiogram tomorrow  Follow-Up:   See me next week    Other Special Instructions:   N/A    If you need a refill on your cardiac medications before your next appointment, please call your pharmacy.   Call the [facility] office at [phone] if you have any questions, problems or concerns.

## 2016-02-05 NOTE — Progress Notes (Signed)
CARDIOLOGY OFFICE NOTE  Date:  02/05/2016    Justin Valdez Date of Birth: 05/14/1986 Medical Record #[id number]  PCP:  Pcp Not In System  Cardiologist:  Tresa EndoKelly    Chief Complaint  Patient presents with  . Shortness of Breath  . Coronary Artery Disease    Work in visit - seen for Dr. Tresa EndoKelly    History of Present Illness: Justin Valdez is a 30 y.o. male who presents today for a work in visit. Seen for Dr. Tresa EndoKelly.  He has a history of HTN and HLD - recently found to have CAD with inferolateral STEMI s/p thrombectomy and PCTA back in March of 2017. Noted at cath to have mild LV dysfunction. No echo noted.   Seen for his post hospital check back at the end of March - felt to be doing ok.   Phone call earlier this week - lots of symptoms - thus added to my schedule.   Comes back today. Here alone. Tells me he has been to 9 different doctors and no one will tell him what's wrong. He has had extensive swelling of the abdomen and legs - this was present prior to his MI - then got better - now has come back. He is adamant about restricting his salt. I can [facility] - liver and heart enlarged. He has pictures on his phone of where he has indentions from so much swelling in his belly. He has this hive like rash when he sweats - see picture. Otherwise no rash. He is not short of breath. Lupus runs in his family. He does not have chest pain. He says he does not use drugs. Does not drink alcohol. Diet history seems ok. His legs ache from so much swelling. No scrotal swelling. He is fatigued. 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   Past Medical History  Diagnosis Date  . Myocardial infarction (HCC)     a. STEMI: 100% dist-LAD stenosis treated w/ thrombectomy and PTCA  . HLD (hyperlipidemia)   . Benign essential HTN     Past Surgical History  Procedure Laterality Date  . Skin graft    . Skin graft    . Cardiac catheterization N/A  12/18/2015    Procedure: Left Heart Cath and Coronary Angiography;  Surgeon: Lennette Biharihomas A Kelly, MD;  [facility];  Service: Cardiovascular;  Laterality: N/A;  . Cardiac catheterization N/A 12/18/2015    Procedure: Coronary Balloon Angioplasty;  Surgeon: Lennette Biharihomas A Kelly, MD;  Location: [facility] INVASIVE CV LAB;  Service: Cardiovascular;  Laterality: N/A;  LAD/atherectomy  . Peripheral vascular catheterization  12/18/2015    Procedure: Thrombectomy;  Surgeon: Lennette Biharihomas A Kelly, MD;  [facility];  Service: Cardiovascular;;     Medications: Current Outpatient Prescriptions  Medication Sig Dispense Refill  . aspirin 81 MG chewable tablet Chew 1 tablet (81 mg total) by mouth daily.    Marland Kitchen. atorvastatin (LIPITOR) 80 MG tablet Take 1 tablet (80 mg total) by mouth daily at 6 PM. 30 tablet 6  . ezetimibe (ZETIA) 10 MG tablet Take 1 tablet (10 mg total) by mouth daily. 90 tablet 3  . losartan (COZAAR) 25 MG tablet Take 1 tablet (25 mg total) by mouth daily. 30 tablet 6  . metoprolol tartrate (LOPRESSOR) 25 MG tablet Take 1 tablet (25 mg total) by mouth 2 (two) times daily. 60 tablet 6  .  nitroGLYCERIN (NITROSTAT) 0.4 MG SL tablet Place 1 tablet (0.4 mg total) under the tongue every 5 (five) minutes as needed for chest pain. 25 tablet 2  . ondansetron (ZOFRAN) 4 MG tablet Take 4 mg by mouth every 8 (eight) hours as needed for nausea or vomiting.    . ticagrelor (BRILINTA) 90 MG TABS tablet Take 1 tablet (90 mg total) by mouth 2 (two) times daily. 30 tablet 9  . furosemide (LASIX) 20 MG tablet Take 2 tablets (40 mg total) by mouth daily. 30 tablet 3  . potassium chloride (K-DUR) 10 MEQ tablet Take 1 tablet (10 mEq total) by mouth daily. 30 tablet 3   No current facility-administered medications for this visit.    Allergies: No Known Allergies  Social History: The patient  reports that he has never smoked. He has quit using smokeless tobacco. He reports that he drinks alcohol. He  reports that he does not use illicit drugs.   Family History: The patient's family history includes Asthma in his maternal grandmother; Cancer - Other (age of onset: 68) in his maternal grandfather; Heart failure (age of onset: 40) in his maternal grandmother; High blood pressure in his maternal grandmother and mother.   Review of Systems: Please see the history of present illness.   Otherwise, the review of systems is positive for none.   All other systems are reviewed and negative.   Physical Exam: VS:  BP 120/68 mmHg  Pulse 60  Ht  (1.676 m)  Wt 232 lb 12.8 oz (105.597 kg)  BMI 37.59 kg/m2 .  BMI Body mass index is 37.59 kg/(m^2).  Wt Readings from Last 3 Encounters:  02/05/16 232 lb 12.8 oz (105.597 kg)  01/06/16 226 lb 4.8 oz (102.649 kg)  12/27/15 213 lb 3.2 oz (96.707 kg)    General: Pleasant. Black male who is alert and in no acute distress.  HEENT: Normal. Neck: Supple, no JVD, carotid bruits, or masses noted.  Cardiac: Regular rate and rhythm. He has a very soft outflow murmur. Diffuse edema in the legs - up past the thighs.  Respiratory:  Lungs are clear to auscultation bilaterally with normal work of breathing.  GI: Soft and nontender.  MS: No deformity or atrophy. Gait and ROM intact. Skin: Warm and dry. Color is normal.  Neuro:  Strength and sensation are intact and no gross focal deficits noted.  Psych: Alert, appropriate and with normal affect.  Picture of rash that occurs when sweating    Picture of indentation of fingers in his abdomen       LABORATORY DATA:  EKG:  EKG is not ordered today.  Lab Results  Component Value Date   WBC 6.2 01/06/2016   HGB 13.3 01/06/2016   HCT 39.4 01/06/2016   PLT 243 01/06/2016   GLUCOSE 92 01/06/2016   CHOL 310* 01/31/2016   TRIG 146 01/31/2016   HDL 52 01/31/2016   LDLCALC 229* 01/31/2016   ALT 30 01/06/2016   AST 24 01/06/2016   NA 141 01/06/2016   K 4.0 01/06/2016   CL 106 01/06/2016   CREATININE  1.04 01/06/2016   BUN 12 01/06/2016   CO2 28 01/06/2016   TSH 1.551 12/18/2015   INR 1.01 12/18/2015   HGBA1C 5.6 12/18/2015    BNP (last 3 results)  Recent Labs  01/06/16 2127  BNP 77.9    ProBNP (last 3 results) No results for input(s): PROBNP in the last 8760 hours.   Other Studies Reviewed Today:  [facility]:  1. CTA of the abdomen and pelvis is within normal limits. 2. Hepatomegaly. 3. Cardiomegaly, which is intervally characterize  Procedures 11/2015   Coronary Balloon Angioplasty   Left Heart Cath and Coronary Angiography   Thrombectomy    Conclusion     Dist LAD lesion, 100% stenosed. Post intervention, there is a 0% residual stenosis.  There is mild left ventricular systolic dysfunction.  Acute ST segment elevation myocardial infarction secondary to thrombotic occlusion of the distal (near apical) LAD.  Normal ramus intermediate, left circumflex, and RCA vessels.  Low-normal acute LV dysfunction with an ejection fraction of 50% with focal area of apical hypocontractility.  Successful PCI to the apical LAD occlusion treated with successful thrombectomy with significant thrombus removal and PTCA with the 100% occlusion being reduced to 0% and restoration of brisk TIMI-3 flow.  RECOMMENDATION: The patient was started on Brilinta in the acute coronary syndrome setting should be maintained with baby aspirin in addition to medical therapy for significant hypertension including beta blocker and ACE/ARB inhibition as well as lipid-lowering therapy.        Assessment/Plan: 1. Massive amount of swelling - noted to have enlarged liver and heart on prior CT. Weight is up almost 20 pounds since last visit here. Getting echo tomorrow. Will give him Lasix/potassium. See back next week. I have talked with Dr. Cyndie Chime by phone regarding this patient - he has suggested drawing hypercoag panel, SPEP and IFE along with my labs  which included CMET, CBC, UA, LFTs, TSH and BNP. Further disposition to follow but may need to get him to hematology. Also need to consider for some type of infiltrative process (amyloid) and may end up needing cardiac MRI. Tentatively seeing back next week.   2. Undefined etiology for his recent MI/PTCA/thrombectomy - he remains on Brilinta - he does not have chest pain.   3. HTN - says this has been long standing - BP ok on current regimen.   4. Murmur - needs echo.  Current medicines are reviewed with the patient today.  The patient does not have concerns regarding medicines other than what has been noted above.  The following changes have been made:  See above.  Labs/ tests ordered today include:    Orders Placed This Encounter  Procedures  . Hypercoagulable panel, comprehensive  . Protein Electrophoresis, (serum)  . Brain natriuretic peptide  . CBC with Differential/Platelet  . Hepatic function panel  . TSH  . Urinalysis  . Protein Electrophoresis, (serum)  . Protein,Total and Elect and IFE,24  . Hypercoagulable panel, comprehensive  . Comprehensive metabolic panel  . ECHOCARDIOGRAM COMPLETE     Disposition:   FU with me next week. Further disposition to follow. Will ask Dr. Tresa Endo to review as well.   Patient is agreeable to this plan and will call if any problems develop in the interim.   Signed: Rosalio Macadamia, RN, ANP-C 02/05/2016 4:18 PM  [facility] [address] [place], [place]  [address] Phone: [phone] Fax: [phone]

## 2016-02-06 ENCOUNTER — Ambulatory Visit (HOSPITAL_COMMUNITY): Payer: BLUE CROSS/BLUE SHIELD | Attending: Internal Medicine

## 2016-02-06 ENCOUNTER — Other Ambulatory Visit: Payer: Self-pay

## 2016-02-06 DIAGNOSIS — I340 Nonrheumatic mitral (valve) insufficiency: Secondary | ICD-10-CM | POA: Diagnosis not present

## 2016-02-06 DIAGNOSIS — E785 Hyperlipidemia, unspecified: Secondary | ICD-10-CM | POA: Diagnosis not present

## 2016-02-06 DIAGNOSIS — I10 Essential (primary) hypertension: Secondary | ICD-10-CM | POA: Diagnosis not present

## 2016-02-06 DIAGNOSIS — I2510 Atherosclerotic heart disease of native coronary artery without angina pectoris: Secondary | ICD-10-CM | POA: Diagnosis not present

## 2016-02-06 DIAGNOSIS — Z8249 Family history of ischemic heart disease and other diseases of the circulatory system: Secondary | ICD-10-CM | POA: Insufficient documentation

## 2016-02-06 LAB — URINALYSIS
Bilirubin Urine: NEGATIVE
Glucose, UA: NEGATIVE
Ketones, ur: NEGATIVE
Leukocytes, UA: NEGATIVE
Nitrite: NEGATIVE
Specific Gravity, Urine: 1.04 — ABNORMAL HIGH (ref 1.001–1.035)
pH: 6 (ref 5.0–8.0)

## 2016-02-06 LAB — COMPREHENSIVE METABOLIC PANEL
ALT: 25 U/L (ref 9–46)
AST: 29 U/L (ref 10–40)
Albumin: 1.5 g/dL — ABNORMAL LOW (ref 3.6–5.1)
Alkaline Phosphatase: 42 U/L (ref 40–115)
BUN: 14 mg/dL (ref 7–25)
CO2: 27 mmol/L (ref 20–31)
Calcium: 7.5 mg/dL — ABNORMAL LOW (ref 8.6–10.3)
Chloride: 105 mmol/L (ref 98–110)
Creat: 1.02 mg/dL (ref 0.60–1.35)
Glucose, Bld: 76 mg/dL (ref 65–99)
Potassium: 4.1 mmol/L (ref 3.5–5.3)
Sodium: 138 mmol/L (ref 135–146)
Total Bilirubin: 1.1 mg/dL (ref 0.2–1.2)
Total Protein: 3.3 g/dL — ABNORMAL LOW (ref 6.1–8.1)

## 2016-02-06 LAB — CBC WITH DIFFERENTIAL/PLATELET
Basophils Absolute: 53 cells/uL (ref 0–200)
Basophils Relative: 1 %
Eosinophils Absolute: 530 cells/uL — ABNORMAL HIGH (ref 15–500)
Eosinophils Relative: 10 %
HCT: 44.5 % (ref 38.5–50.0)
Hemoglobin: 14.9 g/dL (ref 13.2–17.1)
Lymphocytes Relative: 33 %
Lymphs Abs: 1749 cells/uL (ref 850–3900)
MCH: 29.9 pg (ref 27.0–33.0)
MCHC: 33.5 g/dL (ref 32.0–36.0)
MCV: 89.4 fL (ref 80.0–100.0)
MPV: 9.1 fL (ref 7.5–12.5)
Monocytes Absolute: 318 cells/uL (ref 200–950)
Monocytes Relative: 6 %
Neutro Abs: 2650 cells/uL (ref 1500–7800)
Neutrophils Relative %: 50 %
Platelets: 303 10*3/uL (ref 140–400)
RBC: 4.98 MIL/uL (ref 4.20–5.80)
RDW: 14.4 % (ref 11.0–15.0)
WBC: 5.3 10*3/uL (ref 3.8–10.8)

## 2016-02-06 LAB — HEPATIC FUNCTION PANEL
ALT: 25 U/L (ref 9–46)
AST: 29 U/L (ref 10–40)
Albumin: 1.5 g/dL — ABNORMAL LOW (ref 3.6–5.1)
Alkaline Phosphatase: 42 U/L (ref 40–115)
Bilirubin, Direct: 0.1 mg/dL (ref <=0.2)
Indirect Bilirubin: 1 mg/dL (ref 0.2–1.2)
Total Bilirubin: 1.1 mg/dL (ref 0.2–1.2)
Total Protein: 3.3 g/dL — ABNORMAL LOW (ref 6.1–8.1)

## 2016-02-06 LAB — TSH: TSH: 2.4 mIU/L (ref 0.40–4.50)

## 2016-02-06 LAB — BRAIN NATRIURETIC PEPTIDE: Brain Natriuretic Peptide: 37 pg/mL (ref <100)

## 2016-02-07 ENCOUNTER — Other Ambulatory Visit: Payer: Self-pay | Admitting: Nurse Practitioner

## 2016-02-07 LAB — PROTEIN,TOTAL AND ELECT AND IFE,24
Albumin: 67.2 %
Alpha-1-Globulin, U: 10.9 %
Alpha-2-Globulin, U: 6.2 %
Beta Globulin, U: 10.1 %
Collection Interval: 24 hours
Creatinine, 24H Ur: 3 g/(24.h) — ABNORMAL HIGH (ref 0.63–2.50)
Creatinine, Urine: 120 mg/dL (ref 20–370)
Gamma Globulin, U: 5.6 %
Protein Creatinine Ratio: 4042 mg/g creat — ABNORMAL HIGH (ref 22–128)
Protein, 24H Urine: 12125 mg/24 h — ABNORMAL HIGH (ref <150)
Protein, Urine: 485 mg/dL — ABNORMAL HIGH (ref 5–25)
Total Volume, Urine: 2500 mL

## 2016-02-07 LAB — PROTEIN ELECTROPHORESIS, SERUM
Albumin ELP: 1.6 g/dL — ABNORMAL LOW (ref 3.8–4.8)
Alpha-1-Globulin: 0.2 g/dL (ref 0.2–0.3)
Alpha-2-Globulin: 0.8 g/dL (ref 0.5–0.9)
Beta 2: 0.3 g/dL (ref 0.2–0.5)
Beta Globulin: 0.2 g/dL — ABNORMAL LOW (ref 0.4–0.6)
Gamma Globulin: 0.3 g/dL — ABNORMAL LOW (ref 0.8–1.7)
Total Protein, Serum Electrophoresis: 3.3 g/dL — ABNORMAL LOW (ref 6.1–8.1)

## 2016-02-07 LAB — RFX DRVVT SCR W/RFLX CONF 1:1 MIX: dRVVT Screen: 37 s (ref <=45)

## 2016-02-07 LAB — RFX PTT-LA W/RFX TO HEX PHASE CONF: PTT-LA Screen: 40 s (ref <=40)

## 2016-02-11 ENCOUNTER — Telehealth: Payer: Self-pay | Admitting: *Deleted

## 2016-02-11 LAB — HYPERCOAGULABLE PANEL, COMPREHENSIVE
AntiThromb III Func: 74 % activity — ABNORMAL LOW (ref 80–120)
Anticardiolipin IgA: <11 [APL'U]
Anticardiolipin IgG: <14 [GPL'U]
Anticardiolipin IgM: <12 [MPL'U]
Beta-2 Glyco I IgG: <9 SGU (ref <=20)
Beta-2-Glycoprotein I IgA: <9 SAU (ref <=20)
Beta-2-Glycoprotein I IgM: <9 SMU (ref <=20)
PROTEIN S ANTIGEN, TOTAL: 146 % — ABNORMAL HIGH (ref 70–140)
Protein C Activity: 168 % (ref 70–180)
Protein C Antigen: 99 % (ref 70–140)
Protein S Activity: 103 % (ref 70–150)

## 2016-02-11 NOTE — Telephone Encounter (Signed)
S/w pt is aware Dr. Arrie Aranoladonato will be call pt for date and time of appointment.  Pt will be seen tomorrow and will know further details about pt;s condition

## 2016-02-12 ENCOUNTER — Encounter: Payer: Self-pay | Admitting: Nurse Practitioner

## 2016-02-12 ENCOUNTER — Ambulatory Visit (INDEPENDENT_AMBULATORY_CARE_PROVIDER_SITE_OTHER): Payer: BLUE CROSS/BLUE SHIELD | Admitting: Nurse Practitioner

## 2016-02-12 VITALS — BP 90/56 | HR 60 | Ht 66.0 in | Wt 226.2 lb

## 2016-02-12 DIAGNOSIS — E785 Hyperlipidemia, unspecified: Secondary | ICD-10-CM | POA: Diagnosis not present

## 2016-02-12 DIAGNOSIS — I2510 Atherosclerotic heart disease of native coronary artery without angina pectoris: Secondary | ICD-10-CM

## 2016-02-12 DIAGNOSIS — I10 Essential (primary) hypertension: Secondary | ICD-10-CM | POA: Diagnosis not present

## 2016-02-12 LAB — COMPREHENSIVE METABOLIC PANEL
ALT: 40 U/L (ref 9–46)
AST: 24 U/L (ref 10–40)
Albumin: 1.6 g/dL — ABNORMAL LOW (ref 3.6–5.1)
Alkaline Phosphatase: 40 U/L (ref 40–115)
BUN: 15 mg/dL (ref 7–25)
CO2: 30 mmol/L (ref 20–31)
Calcium: 8 mg/dL — ABNORMAL LOW (ref 8.6–10.3)
Chloride: 105 mmol/L (ref 98–110)
Creat: 1.14 mg/dL (ref 0.60–1.35)
Glucose, Bld: 90 mg/dL (ref 65–99)
Potassium: 4.3 mmol/L (ref 3.5–5.3)
Sodium: 141 mmol/L (ref 135–146)
Total Bilirubin: 0.7 mg/dL (ref 0.2–1.2)
Total Protein: 3.6 g/dL — ABNORMAL LOW (ref 6.1–8.1)

## 2016-02-12 NOTE — Patient Instructions (Addendum)
We will be checking the following labs today - CMET   Medication Instructions:    Continue with your current medicines.     Testing/Procedures To Be Arranged:  N/A  Follow-Up:   See Dr. Arrie Aranoladonato for evaluation for possible "nephrotic syndrome" - [place] [address] [place]  Call me on Monday if you have not heard from his office.   See Dr. Tresa EndoKelly in July as planned.     Other Special Instructions:   N/A    If you need a refill on your cardiac medications before your next appointment, please call your pharmacy.   Call the [facility] office at [phone] if you have any questions, problems or concerns.

## 2016-02-12 NOTE — Progress Notes (Signed)
CARDIOLOGY OFFICE NOTE  Date:  02/12/2016    Justin Valdez Date of Birth: 1986-09-09 Medical Record #[id number]  PCP:  Pcp Not In System  Cardiologist:  Tresa Endo    Chief Complaint  Patient presents with  . Edema    Follow up visit - seen for Dr. Tresa Endo    History of Present Illness: Justin Valdez is a 30 y.o. male who presents today for a follow up visit. Seen for Dr. Tresa Endo.  He has a history of HTN and HLD - recently found to have CAD with inferolateral STEMI s/p thrombectomy and PCTA back in March of 2017. Noted at cath to have mild LV dysfunction. No echo noted.   Seen for his post hospital check back at the end of March - felt to be doing ok.   Phone call earlier last week - lots of symptoms - thus added to my schedule. Lots of swelling on exam. No clear etiology other than his elevated cholesterol levels for his recent cardiac event. I spoke with Dr. Cyndie Chime and got numerous labs tests - see below.  I placed him on Lasix/potassium and got an echo.   His labs look to show nephrotic syndrome. I have spoken to Dr. Arrie Aran and he is agreeable to seeing him for evaluation.   Comes back today. Here with his wife today. Little better but still with swelling. Weight is down about 6 pounds. Very little shortness of breath. No chest pain. Wanting to know what's wrong and what the plan    Past Medical History  Diagnosis Date  . Myocardial infarction (HCC)     a. STEMI: 100% dist-LAD stenosis treated w/ thrombectomy and PTCA  . HLD (hyperlipidemia)   . Benign essential HTN     Past Surgical History  Procedure Laterality Date  . Skin graft    . Skin graft    . Cardiac catheterization N/A 12/18/2015    Procedure: Left Heart Cath and Coronary Angiography;  Surgeon: Lennette Bihari, MD;  Location: [facility] INVASIVE CV LAB;  Service: Cardiovascular;  Laterality: N/A;  . Cardiac catheterization N/A 12/18/2015    Procedure: Coronary Balloon Angioplasty;  Surgeon:  Lennette Bihari, MD;  Location: [facility] INVASIVE CV LAB;  Service: Cardiovascular;  Laterality: N/A;  LAD/atherectomy  . Peripheral vascular catheterization  12/18/2015    Procedure: Thrombectomy;  Surgeon: Lennette Bihari, MD;  Location: [facility] INVASIVE CV LAB;  Service: Cardiovascular;;     Medications: Current Outpatient Prescriptions  Medication Sig Dispense Refill  . aspirin 81 MG chewable tablet Chew 1 tablet (81 mg total) by mouth daily.    Marland Kitchen atorvastatin (LIPITOR) 80 MG tablet Take 1 tablet (80 mg total) by mouth daily at 6 PM. 30 tablet 6  . ezetimibe (ZETIA) 10 MG tablet Take 1 tablet (10 mg total) by mouth daily. 90 tablet 3  . furosemide (LASIX) 20 MG tablet Take 2 tablets (40 mg total) by mouth daily. 30 tablet 3  . losartan (COZAAR) 25 MG tablet Take 1 tablet (25 mg total) by mouth daily. 30 tablet 6  . metoprolol tartrate (LOPRESSOR) 25 MG tablet Take 1 tablet (25 mg total) by mouth 2 (two) times daily. 60 tablet 6  . nitroGLYCERIN (NITROSTAT) 0.4 MG SL tablet Place 1 tablet (0.4 mg total) under the tongue every 5 (five) minutes as needed for chest pain. 25 tablet 2  . ondansetron (ZOFRAN) 4 MG tablet Take 4 mg by mouth every 8 (eight) hours as needed  for nausea or vomiting.    . potassium chloride (K-DUR) 10 MEQ tablet Take 1 tablet (10 mEq total) by mouth daily. 30 tablet 3  . ticagrelor (BRILINTA) 90 MG TABS tablet Take 1 tablet (90 mg total) by mouth 2 (two) times daily. 30 tablet 9   No current facility-administered medications for this visit.    Allergies: No Known Allergies  Social History: The patient  reports that he has never smoked. He has quit using smokeless tobacco. He reports that he drinks alcohol. He reports that he does not use illicit drugs.   Family History: The patient's family history includes Asthma in his maternal grandmother; Cancer - Other (age of onset: 86) in his maternal grandfather; Heart failure (age of onset: 71) in his maternal grandmother; High blood  pressure in his maternal grandmother and mother.   Review of Systems: Please see the history of present illness.   Otherwise, the review of systems is positive for none.   All other systems are reviewed and negative.   Physical Exam: VS:  BP 90/56 mmHg  Pulse 60  Ht 5\' 6"  (1.676 m)  Wt 226 lb 3.2 oz (102.604 kg)  BMI 36.53 kg/m2 .  BMI Body mass index is 36.53 kg/(m^2).  Wt Readings from Last 3 Encounters:  02/12/16 226 lb 3.2 oz (102.604 kg)  02/05/16 232 lb 12.8 oz (105.597 kg)  01/06/16 226 lb 4.8 oz (102.649 kg)    General: Pleasant. Well developed, well nourished and in no acute distress.  HEENT: Normal. Neck: Supple, no JVD, carotid bruits, or masses noted.  Cardiac: Regular rate and rhythm. Soft outflow murmur. Over 2+ edema.  Respiratory:  Lungs are clear to auscultation bilaterally with normal work of breathing.  GI: Soft and nontender. Belly looks less swollen today.  MS: No deformity or atrophy. Gait and ROM intact. Skin: Warm and dry. Color is normal.  Neuro:  Strength and sensation are intact and no gross focal deficits noted.  Psych: Alert, appropriate and with normal affect.   LABORATORY DATA:  EKG:  EKG is not ordered today.  Lab Results  Component Value Date   WBC 5.3 02/05/2016   HGB 14.9 02/05/2016   HCT 44.5 02/05/2016   PLT 303 02/05/2016   GLUCOSE 76 02/05/2016   CHOL 310* 01/31/2016   TRIG 146 01/31/2016   HDL 52 01/31/2016   LDLCALC 229* 01/31/2016   ALT 25 02/05/2016   ALT 25 02/05/2016   AST 29 02/05/2016   AST 29 02/05/2016   NA 138 02/05/2016   K 4.1 02/05/2016   CL 105 02/05/2016   CREATININE 1.02 02/05/2016   BUN 14 02/05/2016   CO2 27 02/05/2016   TSH 2.40 02/05/2016   INR 1.01 12/18/2015   HGBA1C 5.6 12/18/2015   AntiThromb III Func 80 - 120 % activity 74 (L)     Comments: The major differential diagnosis of a hereditary  ATIII deficiency includes consumption (i.e. recent  thrombosis or DIC), heparin therapy and nephrotic    range proteinuria.      Protein C Antigen 70 - 140 % 99    Comments: Units: % of normal    PROTEIN S ANTIGEN, TOTAL 70 - 140 % 146 (H)    Comments: Units: % of normal    Lupus Anticoagulant Eval  REPORT    Comments: A Lupus Anticoagulant is not detected.  Reference Range: Not Detected  http://education.[facility].com/faq/LupusAnticoag  -------------------------------------------------------  This interpretation is based on the following test  results.  Beta-2 Glyco I IgG <=20 SGU <9     Beta-2-Glycoprotein I IgM <=20 SMU <9     Beta-2-Glycoprotein I IgA <=20 SAU <9    Comments:   The Antiphospholipid Antibody Syndrome (APS) is a clinical-pathologic  correlation that includes a clinical event (e.g. thrombosis, pregnancy  loss, thrombocytopenia) and persistent positive Antiphospholipid  Antibodies (IgM or IgG ACA >40 MPL/GPL, IgM or IgG anti-B2GPI  antibodies, or a Lupus Anticoagulant). The IgA isotype has been  implicated in smaller studies, but have not yet been incorporated into  the APS criteria. International consensus guidelines suggest waiting  at least 12 weeks before retesting to confirm antibody persistence.  Reference Terese Door Haemost 2006: 4; 295    For more information on this test, go to  http://education.[facility].com/faq/FAQ109      Anticardiolipin IgA APL <11    Comments:                   Value   Interpretation                  -------   --------------                 < or = 11   Negative                  12 - 20   Indeterminate                  21 - 80   Low to Medium Positive                   > 80   High Positive      Anticardiolipin IgG GPL <14    Comments:                   Value   Interpretation                  -------   --------------                  < or = 14   Negative                  15 - 20   Indeterminate                  21 - 80   Low to Medium Positive                   > 80   High Positive      Anticardiolipin IgM MPL <12    Comments:                   Value   Interpretation                  -------   --------------                 < or = 12   Negative                  13 - 20   Indeterminate                  21 - 80   Low to Medium Positive                   > 80   High Positive  The antiphospholipid antibody syndrome (APS) is a clinical-pathologic  correlation that includes a clinical event (e.g. thrombosis, pregnancy  loss, thrombocytopenia) and persistent positive antiphospholipid  antibodies (IgM or IgG ACA >40 MPL/GPL, IgM or IgG anti-b2GPI  antibodies or a lupus anticoagulant). The IgA isotype has been  implicated in smaller studies, but have not yet been incorporated into  the APS criteria. International consensus guidelines suggest waiting  at least 12 weeks before retesting to confirm antibody persistence.  Reference: J Thromb Haemost 2006: 4; 295      Result  REPORT    Comments: FACTOR V LEIDEN (R506Q) MUTATION NOT DETECTED    Interpretation  REPORT    Comments: This individual is negative (normal) for the Factor V  Leiden (R506Q) mutation in the Factor V gene.  Increased risk of thrombophilia can be caused by a  variety of genetic and non-genetic factors not  screened for by this assay.      Reviewer  REPORT    Comments: Delton CoombesKasinathan Muralidharan, Ph.D.,FACMG,  Director, Molecular Genetics  SUPPLEMENTAL INFORMATION  The Factor V Leiden (R506Q) mutation [[id number]:c.  1601G>A (p.R534Q)] in the Factor V gene is one of the  most common causes of inherited thrombophilia. This  mutation causes resistance to degradation of activated   Factor V protein by activated Protein C (APC).  The Factor V Leiden (R506Q) mutation is detected by  amplification of the selected region of Factor V gene  by polymerase chain reaction (PCR) and fluorescent  probe hybridization to the targeted region, followed by  melting curve analysis with a real time PCR system.  Although rare, false positive or false negative results  may occur. All results should be interpreted in  context of clinical findings, relevant history, and  other laboratory data.  Health care providers, please contact your local  [facility] genetic counselor or call  866-GENEINFO [phone]) for assistance with  interpretation of these results.  This test was developed and its analytical performance  characteristics have been determined by [facility], [place], [place]. It has  not been cleared or approved by the U.S. Food and Drug  Administration. This assay has been validated pursuant  to the CLIA regulations and is used for clinical  purposes.      Protein C Activity 70 - 180 % 168    Comments: Units: % of normal    Protein S Activity 70 - 150 % 103     Result  REPORT    Comments: THE G20210A MUTATION NOT DETECTED    Interpretation  REPORT CANCELEDCM   Comments: This individual is negative (normal) for the G20210A  mutation in the Prothrombin/Factor II gene. Increased  risk of thrombophilia can be caused by a variety of  genetic and non-genetic factors not screened for by  this assay.      Reviewer  REPORT    Comments: Delton CoombesKasinathan Muralidharan, Ph.D.,FACMG,  Director, Molecular Genetics  SUPPLEMENTAL INFORMATION  The G20210A mutation [[id number]:g.21538G>A (c.*97G>A)]  in the Prothrombin/Factor II gene is the second most  common inherited risk factor for thrombosis occurring  in approximately 2% of Caucasians. Presence of the  mutation is associated with an elevation of prothrombin  levels to about 30% above  normal in heterozygotes and  to 70% above normal in homozygotes.  The G20210A mutation is detected by amplification of  the selected region of Factor II gene by polymerase  chain reaction (PCR) and fluorescent probe  hybridization to the targeted region, followed by  melting curve analysis with a real time PCR system.  Although rare, false positive or false negative results  may occur. All results should be interpreted in  context of clinical findings, relevant history, and  other laboratory data.  Health care providers, please contact your local  [facility] genetic counselor or call  866-GENEINFO [phone]) for assistance with  interpretation of these results.  This test was developed and its analytical performance  characteristics have been determined by [facility], [place], [place]. It has  not been cleared or approved by the U.S. Food and Drug  Administration. This assay has been validated pursuant  to the CLIA regulations and is used for clinical  purposes.         dRVVT Mix Interp.  REPORT   Comments: Not Indicated   dRVVT Screen <=45 sec 37   dRVVT  REPORT   Comments: Additional testing is not indicated.   Resulting Agency [facility]     Narrative     Performed at: [facility]       PTT-LA Screen <=40 sec 40   Additional Testing  REPORT   Comments: Not indicated         Total Protein, Serum Electrophoresis 6.1 - 8.1 g/dL 3.3 (L)      Albumin ELP 3.8 - 4.8 g/dL 1.6 (L)     UJWJX-9-JYNWGNFA 0.2 - 0.3 g/dL 0.2     OZHYQ-6-VHQIONGE 0.5 - 0.9 g/dL 0.8     Beta Globulin 0.4 - 0.6 g/dL 0.2 (L)     Beta 2 0.2 - 0.5 g/dL 0.3     Gamma Globulin 0.8 - 1.7 g/dL 0.3 (L)     Abnormal Protein Band1 g/dL NOT DET     SPE Interp.  SEE NOTE    Comments: The possibility of a faint restricted band (s) cannot be completely  excluded in the beta-2 and gamma regions. Suggest serum IFE to  evaluate possibility, if  clinically indicated. (Lab will hold sample  one week.)  Reviewed by Dallas Breeding, MD, PhD, FCAP (Electronic Signature on  File)      Abnormal Protein Band2 g/dL NOT DET     Abnormal Protein Band3 g/dL NOT DET          Total Bilirubin 0.2 - 1.2 mg/dL 1.1 1.1 9.5M     Bilirubin, Direct <=0.2 mg/dL 0.1      Indirect Bilirubin 0.2 - 1.2 mg/dL 1.0      Alkaline Phosphatase 40 - 115 U/L 42 42 49R    AST 10 - 40 U/L 29 29 24R    ALT 9 - 46 U/L 25 25 30R    Total Protein 6.1 - 8.1 g/dL 3.3 (L) 3.3 (L) 3.7 (L)R    Albumin 3.6 - 5.1 g/dL 1.5 (L) 1.5 (L) 1.3 (L)R         BNP (last 3 results)  Recent Labs  01/06/16 2127  BNP 77.9   Brain Natriuretic Peptide <100 pg/mL 37.0       ProBNP (last 3 results) No results for input(s): PROBNP in the last 8760 hours.   Other Studies Reviewed Today:  Echo Study Conclusions from 01/2016  - Left ventricle: The cavity size was normal. Wall thickness was  normal. Systolic function was normal. The estimated ejection  fraction was in the range of 60% to 65%. Wall motion was normal;  there were no regional wall motion abnormalities. Left  ventricular diastolic function parameters were normal. - Mitral valve: Mildly thickened leaflets . There was mild  regurgitation. -  Left atrium: The atrium was normal in size. - Inferior vena cava: The vessel was normal in size. The  respirophasic diameter changes were in the normal range (= 50%),  consistent with normal central venous pressure.  Impressions:  - LVEF 60-65%, normal wall thickness, normal wall motion, normal  diastolic function, mild mitral leaflet thickening with mild  regurgitation, normal LA size, normal IVC.    [facility] CONCLUSION [facility]/2015 [facility]:  1. CTA of the abdomen and pelvis is within normal limits. 2. Hepatomegaly. 3. Cardiomegaly, which is intervally characterize  Procedures 11/2015   Coronary Balloon Angioplasty   Left Heart  Cath and Coronary Angiography   Thrombectomy    Conclusion     Dist LAD lesion, 100% stenosed. Post intervention, there is a 0% residual stenosis.  There is mild left ventricular systolic dysfunction.  Acute ST segment elevation myocardial infarction secondary to thrombotic occlusion of the distal (near apical) LAD.  Normal ramus intermediate, left circumflex, and RCA vessels.  Low-normal acute LV dysfunction with an ejection fraction of 50% with focal area of apical hypocontractility.  Successful PCI to the apical LAD occlusion treated with successful thrombectomy with significant thrombus removal and PTCA with the 100% occlusion being reduced to 0% and restoration of brisk TIMI-3 flow.  RECOMMENDATION: The patient was started on Brilinta in the acute coronary syndrome setting should be maintained with baby aspirin in addition to medical therapy for significant hypertension including beta blocker and ACE/ARB inhibition as well as lipid-lowering therapy.        Assessment/Plan: 1. Massive amount of swelling - noted to have enlarged liver and heart on prior CT. Weight was up almost 20 pounds prior to last visit here. Echo is basically ok. His labs look to show nephrotic syndrome. I spoke with Dr. Arrie Aran yesterday and he is agreeable to seeing him. Will keep him on Lasix and recheck his labs today.   2. Undefined etiology for his recent MI/PTCA/thrombectomy other than elevated lipids (but this is also seen with nephrotic syndrome) - he remains on Brilinta - he does not have chest pain. To see Dr. Tresa Endo later this summer for further disposition.   3. HTN - says this has been long standing - BP great on current regimen. Not dizzy or lightheaded.        Current medicines are reviewed with the patient today.  The patient does not have concerns regarding medicines other than what has been noted above.  The following changes have been made:  See above.  Labs/ tests  ordered today include:    Orders Placed This Encounter  Procedures  . Comprehensive metabolic panel     Disposition:   FU with Dr. Tresa Endo as planned.   Patient is agreeable to this plan and will call if any problems develop in the interim.   Signed: Rosalio Macadamia, RN, ANP-C 02/12/2016 2:18 PM  [facility] [address] [place], [place]  [address] Phone: [phone] Fax: [phone]

## 2016-02-19 ENCOUNTER — Telehealth: Payer: Self-pay | Admitting: *Deleted

## 2016-02-19 NOTE — Telephone Encounter (Signed)
S/w pt does have appointment with Dr. Annia Friendlyolonadto's  office on June 5. Will call and discuss after visit. Will send to [employer]ori G to [place].

## 2016-02-19 NOTE — Telephone Encounter (Signed)
Great!

## 2016-03-03 ENCOUNTER — Telehealth: Payer: Self-pay | Admitting: Nurse Practitioner

## 2016-03-03 NOTE — Telephone Encounter (Signed)
Pt was seen by Dr. Lorita Officerolanado's office yesterday stated suppose to be receiving message from office stating pt is to have a biopsy either this week or next and wants to know about Brilinta.  Also stated lot of blood in urine. Was started on prednisone ( 20 mg ) tid and increased lasix ( 80 mg ) daily.  Will route to Norma FredricksonLori Gerhardt, NP to advise.

## 2016-03-03 NOTE — Telephone Encounter (Signed)
Justin Valdez is calling about his Brilinta. Please call

## 2016-03-04 ENCOUNTER — Other Ambulatory Visit (HOSPITAL_COMMUNITY): Payer: Self-pay | Admitting: Nephrology

## 2016-03-04 DIAGNOSIS — N049 Nephrotic syndrome with unspecified morphologic changes: Secondary | ICD-10-CM

## 2016-03-04 NOTE — Telephone Encounter (Signed)
Discussed with Dr. Excell Seltzerooper in the office on June 6th, ok to stop the Brilinta altogether due to upcoming biopsy and hematuria and can stop now.  May consider Plavix on his return visit with Dr. Tresa EndoKelly.  He does not have a stent in place. He needs to keep his follow up with Dr. Tresa EndoKelly as planned.

## 2016-03-04 NOTE — Telephone Encounter (Signed)
S/w pt is aware to stop Brilinta, pt is agreeable to plan.  Will call Dr. Gillis Santaolonadato's office today to send us records. Pt stated lots of swelling today.  Norma FredricksonLori Gerhardt, Np stated probably due to prednisone.  Also told pt when call doctor's office today to make sure to let them know about swelling.

## 2016-03-06 ENCOUNTER — Other Ambulatory Visit: Payer: Self-pay | Admitting: Radiology

## 2016-03-09 ENCOUNTER — Other Ambulatory Visit: Payer: Self-pay | Admitting: Radiology

## 2016-03-10 ENCOUNTER — Ambulatory Visit (HOSPITAL_COMMUNITY)
Admission: RE | Admit: 2016-03-10 | Discharge: 2016-03-10 | Disposition: A | Discharge status: Home / Self Care | Payer: BLUE CROSS/BLUE SHIELD | Source: Ambulatory Visit | Attending: Nephrology | Admitting: Nephrology

## 2016-03-10 ENCOUNTER — Encounter (HOSPITAL_COMMUNITY): Payer: Self-pay

## 2016-03-10 DIAGNOSIS — N049 Nephrotic syndrome with unspecified morphologic changes: Secondary | ICD-10-CM | POA: Insufficient documentation

## 2016-03-10 LAB — CBC
HCT: 44.8 % (ref 39.0–52.0)
Hemoglobin: 15.3 g/dL (ref 13.0–17.0)
MCH: 29.3 pg (ref 26.0–34.0)
MCHC: 34.2 g/dL (ref 30.0–36.0)
MCV: 85.8 fL (ref 78.0–100.0)
PLATELETS: 400 10*3/uL (ref 150–400)
RBC: 5.22 MIL/uL (ref 4.22–5.81)
RDW: 13.4 % (ref 11.5–15.5)
WBC: 13.7 10*3/uL — ABNORMAL HIGH (ref 4.0–10.5)

## 2016-03-10 LAB — PROTIME-INR
INR: 1.08 (ref 0.00–1.49)
PROTHROMBIN TIME: 14.2 s (ref 11.6–15.2)

## 2016-03-10 LAB — APTT: aPTT: 25 seconds (ref 24–37)

## 2016-03-10 MED ORDER — MIDAZOLAM HCL 2 MG/2ML IJ SOLN
INTRAMUSCULAR | Status: AC | PRN
  Administered 2016-03-10: 0.5 mg via INTRAVENOUS
  Administered 2016-03-10: 1 mg via INTRAVENOUS

## 2016-03-10 MED ORDER — SODIUM CHLORIDE 0.9 % IV SOLN
INTRAVENOUS | Status: AC | PRN
  Administered 2016-03-10: 10 mL/h via INTRAVENOUS

## 2016-03-10 MED ORDER — FENTANYL CITRATE (PF) 100 MCG/2ML IJ SOLN
INTRAMUSCULAR | Status: AC | PRN
  Administered 2016-03-10: 50 ug via INTRAVENOUS
  Administered 2016-03-10: 25 ug via INTRAVENOUS

## 2016-03-10 MED ORDER — GELATIN ABSORBABLE 12-7 MM EX MISC
CUTANEOUS | Status: AC
  Filled 2016-03-10: qty 1

## 2016-03-10 MED ORDER — SODIUM CHLORIDE 0.9 % IV SOLN: Freq: Once | INTRAVENOUS | Status: DC

## 2016-03-10 MED ORDER — MIDAZOLAM HCL 2 MG/2ML IJ SOLN
INTRAMUSCULAR | Status: AC
  Filled 2016-03-10: qty 2

## 2016-03-10 MED ORDER — FENTANYL CITRATE (PF) 100 MCG/2ML IJ SOLN
INTRAMUSCULAR | Status: AC
  Filled 2016-03-10: qty 2

## 2016-03-10 MED ORDER — LIDOCAINE HCL (PF) 1 % IJ SOLN
INTRAMUSCULAR | Status: AC
  Filled 2016-03-10: qty 10

## 2016-03-10 NOTE — Procedures (Signed)
Interventional Radiology Procedure Note  Procedure: Random renal bx under US guidance.   Complications: None.  Estimated Blood Loss: None.  Recommendations:   - Bedrest x 4 hrs - Path pending  Signed,  Sterling BigHeath K. Danetta Prom, MD

## 2016-03-10 NOTE — H&P (Signed)
Chief Complaint: Patient was seen in consultation today for random renal biopsy at the request of Coladonato,Joseph  Referring Physician(s): Coladonato,Joseph  Supervising Physician: Malachy Moan  Patient Status:  Out-pt  History of Present Illness: Justin Valdez is a 30 y.o. male   Dx edema lower extremities and genitalia Dec 2016 Medication --- some relief Suffered MI 11/2015: thrombectomy and PTCA Feeling better but edema recurred and included upper extremities Cardiology follow up revealed nephrotic syndrome Referred to Dr Arrie Aran: Proteinuria; hematuria Nephrotic syndrome  Scheduled for random renal bx  Last dose Brilinta 6/6 pm  Past Medical History  Diagnosis Date  . Myocardial infarction (HCC)     a. STEMI: 100% dist-LAD stenosis treated w/ thrombectomy and PTCA  . HLD (hyperlipidemia)   . Benign essential HTN     Past Surgical History  Procedure Laterality Date  . Skin graft    . Skin graft    . Cardiac catheterization N/A 12/18/2015    Procedure: Left Heart Cath and Coronary Angiography;  Surgeon: Lennette Bihari, MD;  Location: [facility] INVASIVE CV LAB;  Service: Cardiovascular;  Laterality: N/A;  . Cardiac catheterization N/A 12/18/2015    Procedure: Coronary Balloon Angioplasty;  Surgeon: Lennette Bihari, MD;  Location: [facility] INVASIVE CV LAB;  Service: Cardiovascular;  Laterality: N/A;  LAD/atherectomy  . Peripheral vascular catheterization  12/18/2015    Procedure: Thrombectomy;  Surgeon: Lennette Bihari, MD;  Location: [facility] INVASIVE CV LAB;  Service: Cardiovascular;;    Allergies: Review of patient's allergies indicates no known allergies.  Medications: Prior to Admission medications   Medication Sig Start Date End Date Taking? Authorizing Provider  aspirin 81 MG chewable tablet Chew 1 tablet (81 mg total) by mouth daily. 12/20/15  Yes Ellsworth Lennox, PA  atorvastatin (LIPITOR) 80 MG tablet Take 1 tablet (80 mg total) by mouth daily at 6 PM.  12/20/15  Yes Ellsworth Lennox, PA  ezetimibe (ZETIA) 10 MG tablet Take 1 tablet (10 mg total) by mouth daily. 02/04/16  Yes Janetta Hora, PA-C  furosemide (LASIX) 20 MG tablet Take 2 tablets (40 mg total) by mouth daily. 02/05/16  Yes Rosalio Macadamia, NP  losartan (COZAAR) 25 MG tablet Take 1 tablet (25 mg total) by mouth daily. 12/20/15  Yes Ellsworth Lennox, PA  metoprolol tartrate (LOPRESSOR) 25 MG tablet Take 1 tablet (25 mg total) by mouth 2 (two) times daily. 12/20/15  Yes Ellsworth Lennox, PA  potassium chloride (K-DUR) 10 MEQ tablet Take 1 tablet (10 mEq total) by mouth daily. 02/05/16  Yes Rosalio Macadamia, NP  ticagrelor (BRILINTA) 90 MG TABS tablet Take 1 tablet (90 mg total) by mouth 2 (two) times daily. 12/20/15  Yes Ellsworth Lennox, PA  nitroGLYCERIN (NITROSTAT) 0.4 MG SL tablet Place 1 tablet (0.4 mg total) under the tongue every 5 (five) minutes as needed for chest pain. 12/20/15   Ellsworth Lennox, PA  ondansetron (ZOFRAN) 4 MG tablet Take 4 mg by mouth every 8 (eight) hours as needed for nausea or vomiting.    Historical Provider, MD     Family History  Problem Relation Age of Onset  . High blood pressure Mother   . Cancer - Other Maternal Grandfather 37    deceased/pancreatic cancer  . High blood pressure Maternal Grandmother   . Heart failure Maternal Grandmother 51    deceased  . Asthma Maternal Grandmother     deceased    Social History   Social  History  . Marital Status: Married    Spouse Name: N/A  . Number of Children: N/A  . Years of Education: N/A   Social History Main Topics  . Smoking status: Never Smoker   . Smokeless tobacco: Former Neurosurgeon  . Alcohol Use: Yes     Comment: Occassional Use  . Drug Use: No  . Sexual Activity: Yes   Other Topics Concern  . None   Social History Narrative      Review of Systems: A 12 point ROS discussed and pertinent positives are indicated in the HPI above.  All other systems are negative.  Review of  Systems  Constitutional: Positive for activity change and fatigue. Negative for fever.  HENT: Negative for trouble swallowing.   Respiratory: Negative for shortness of breath.   Neurological: Negative for weakness.  Psychiatric/Behavioral: Negative for behavioral problems, confusion and decreased concentration.    Vital Signs: BP 136/78 mmHg  Temp(Src) 97.5 F (36.4 C) (Oral)  Resp 18  Ht  (1.753 m)  Wt 232 lb (105.235 kg)  BMI 34.24 kg/m2  SpO2 100%  Physical Exam  Constitutional: He is oriented to person, place, and time.  Cardiovascular: Normal rate, regular rhythm and normal heart sounds.   No murmur heard. Pulmonary/Chest: Effort normal and breath sounds normal. He has no wheezes.  Abdominal: Soft. Bowel sounds are normal. There is no tenderness.  Musculoskeletal: Normal range of motion. He exhibits edema.  Neurological: He is alert and oriented to person, place, and time.  Skin: Skin is warm and dry.  Psychiatric: He has a normal mood and affect. His behavior is normal. Judgment and thought content normal.  Nursing note and vitals reviewed.   Mallampati Score:  MD Evaluation Airway: WNL Heart: WNL Abdomen: WNL Chest/ Lungs: WNL ASA  Classification: 2 Mallampati/Airway Score: One  Imaging: No results found.  Labs:  CBC:  Recent Labs  12/19/15 0500 01/06/16 2127 02/05/16 1656 03/10/16 0604  WBC 8.3 6.2 5.3 13.7*  HGB 13.9 13.3 14.9 15.3  HCT 41.4 39.4 44.5 44.8  PLT 374 243 303 400    COAGS:  Recent Labs  12/18/15 1630 03/10/16 0604  INR 1.01 1.08  APTT 25 25    BMP:  Recent Labs  12/18/15 1630  12/19/15 0500 01/06/16 2127 02/05/16 1656 02/12/16 1427  NA 136  < > 138 141 138 141  K 2.8*  < > 4.3 4.0 4.1 4.3  CL 104  < > 107 106 105 105  CO2 20*  --  GLUCOSE 136*  < > 107* 92 76 90  BUN 9  < > CALCIUM 8.0*  --  7.6* 8.2* 7.5* 8.0*  CREATININE 1.05  < > 0.86 1.04 1.02 1.14  GFRNONAA >60  --  >60 >60   --   --   GFRAA >60  --  >60 >60  --   --   < > = values in this interval not displayed.  LIVER FUNCTION TESTS:  Recent Labs  12/18/15 1630 01/06/16 2127 02/05/16 1656 02/12/16 1427  BILITOT 0.7 0.5 1.1  1.1 0.7  AST 32 ALT 40  ALKPHOS 41 49 42  42 40  PROT 4.2* 3.7* 3.3*  3.3* 3.6*  ALBUMIN 1.5* 1.3* 1.5*  1.5* 1.6*    TUMOR MARKERS: No results for input(s): AFPTM, CEA, CA199, CHROMGRNA in the last 8760 hours.  Assessment and Plan:  Nephrotic syndrome Proteinuria/hematuria Scheduled for random renal biopsy Risks and Benefits discussed with the patient including, but not limited to bleeding, infection, damage to adjacent structures or low yield requiring additional tests. All of the patient's questions were answered, patient is agreeable to proceed. Consent signed and in chart.   Thank you for this interesting consult.  I greatly enjoyed meeting Justin Valdez and look forward to participating in their care.  A copy of this report was sent to the requesting provider on this date.  Electronically Signed: Ambriel Gorelick A 03/10/2016, 7:21 AM   I spent a total of  30 Minutes   in face to face in clinical consultation, greater than 50% of which was counseling/coordinating care for Random Renal Bx

## 2016-03-10 NOTE — Discharge Instructions (Signed)
Kidney Biopsy, Care After Refer to this sheet in the next few weeks. These instructions provide you with information on caring for yourself after your procedure. Your health care provider may also give you more specific instructions. Your treatment has been planned according to current medical practices, but problems sometimes occur. Call your health care provider if you have any problems or questions after your procedure.  WHAT TO EXPECT AFTER THE PROCEDURE   You may notice blood in the urine for the first 24 hours after the biopsy.  You may feel some pain at the biopsy site for 1-2 weeks after the biopsy. HOME CARE INSTRUCTIONS  Do not lift anything heavier than 10 lb (4.5 kg) for 2 weeks.  Do not take any non-steroidal anti-inflammatory drugs (NSAIDs) or any blood thinners for a week after the biopsy unless instructed to do so by your health care provider.  Only take medicines for pain, fever, or discomfort as directed by your health care provider. SEEK MEDICAL CARE IF:  You have bloody urine more than 24 hours after the biopsy.   You develop a fever.   You cannot urinate.   You have increasing pain at the biopsy site.  SEEK IMMEDIATE MEDICAL CARE IF: You feel faint or dizzy.    This information is not intended to replace advice given to you by your health care provider. Make sure you discuss any questions you have with your health care provider.   Document Released: 05/17/2013 Document Reviewed: 05/17/2013 Elsevier Interactive Patient Education [employer]2016 Elsevier Inc.                         Excuse from Work, [employer], or Physical Activity ________________________[name] __________________________ needs to be excused from: __x___ Work _____ [employer] __x___ Physical activity beginning now and through the following date: ____________________. _____ He or she may return to work or school but should still avoid the following physical activity or  activities from now until ____________________. Activity restrictions include: _____ Lifting more than ___10____ lb _____ Sitting longer than __________ minutes at a time _____ Standing longer than ________ minutes at a time __x___ He or she may return to full physical activity as of _________6/27/17___________. Health Care Provider Name (printed): _______Dr. Coladona_________________________________    This information is not intended to replace advice given to you by your health care provider. Make sure you discuss any questions you have with your health care provider.   Document Released: 03/10/2001 Document Revised: 10/05/2014 Document Reviewed: 04/16/2014 Elsevier Interactive Patient Education [employer]2016 Elsevier Inc.

## 2016-03-11 ENCOUNTER — Telehealth: Payer: Self-pay | Admitting: Cardiovascular Disease

## 2016-03-11 ENCOUNTER — Encounter (HOSPITAL_COMMUNITY): Payer: Self-pay | Admitting: Emergency Medicine

## 2016-03-11 ENCOUNTER — Emergency Department (HOSPITAL_COMMUNITY)
Admission: EM | Admit: 2016-03-11 | Discharge: 2016-03-11 | Disposition: A | Discharge status: Home / Self Care | Payer: BLUE CROSS/BLUE SHIELD | Attending: Emergency Medicine | Admitting: Emergency Medicine

## 2016-03-11 ENCOUNTER — Emergency Department (HOSPITAL_COMMUNITY): Payer: BLUE CROSS/BLUE SHIELD

## 2016-03-11 DIAGNOSIS — R1084 Generalized abdominal pain: Secondary | ICD-10-CM | POA: Insufficient documentation

## 2016-03-11 DIAGNOSIS — Z7982 Long term (current) use of aspirin: Secondary | ICD-10-CM | POA: Insufficient documentation

## 2016-03-11 DIAGNOSIS — I252 Old myocardial infarction: Secondary | ICD-10-CM | POA: Insufficient documentation

## 2016-03-11 DIAGNOSIS — R319 Hematuria, unspecified: Secondary | ICD-10-CM | POA: Diagnosis not present

## 2016-03-11 DIAGNOSIS — E785 Hyperlipidemia, unspecified: Secondary | ICD-10-CM | POA: Insufficient documentation

## 2016-03-11 DIAGNOSIS — Z79899 Other long term (current) drug therapy: Secondary | ICD-10-CM | POA: Insufficient documentation

## 2016-03-11 DIAGNOSIS — I10 Essential (primary) hypertension: Secondary | ICD-10-CM | POA: Insufficient documentation

## 2016-03-11 LAB — URINALYSIS, ROUTINE W REFLEX MICROSCOPIC
BILIRUBIN URINE: NEGATIVE
Glucose, UA: NEGATIVE mg/dL
KETONES UR: NEGATIVE mg/dL
Leukocytes, UA: NEGATIVE
Nitrite: NEGATIVE
Specific Gravity, Urine: 1.034 — ABNORMAL HIGH (ref 1.005–1.030)
pH: 6 (ref 5.0–8.0)

## 2016-03-11 LAB — COMPREHENSIVE METABOLIC PANEL
ALBUMIN: 1.4 g/dL — AB (ref 3.5–5.0)
ALK PHOS: 51 U/L (ref 38–126)
ALT: 43 U/L (ref 17–63)
AST: 20 U/L (ref 15–41)
Anion gap: 3 — ABNORMAL LOW (ref 5–15)
BILIRUBIN TOTAL: 1.2 mg/dL (ref 0.3–1.2)
BUN: 23 mg/dL — ABNORMAL HIGH (ref 6–20)
CALCIUM: 7.4 mg/dL — AB (ref 8.9–10.3)
CO2: 32 mmol/L (ref 22–32)
CREATININE: 1.15 mg/dL (ref 0.61–1.24)
Chloride: 102 mmol/L (ref 101–111)
GFR calc non Af Amer: >60 mL/min (ref >60)
Glucose, Bld: 95 mg/dL (ref 65–99)
Potassium: 4.1 mmol/L (ref 3.5–5.1)
SODIUM: 137 mmol/L (ref 135–145)
Total Protein: 4 g/dL — ABNORMAL LOW (ref 6.5–8.1)

## 2016-03-11 LAB — URINE MICROSCOPIC-ADD ON

## 2016-03-11 LAB — CBC WITH DIFFERENTIAL/PLATELET
Basophils Absolute: 0 10*3/uL (ref 0.0–0.1)
Basophils Relative: 0 %
EOS ABS: 0.1 10*3/uL (ref 0.0–0.7)
EOS PCT: 1 %
HCT: 47.8 % (ref 39.0–52.0)
Hemoglobin: 16.6 g/dL (ref 13.0–17.0)
LYMPHS ABS: 2.5 10*3/uL (ref 0.7–4.0)
Lymphocytes Relative: 24 %
MCH: 30.4 pg (ref 26.0–34.0)
MCHC: 34.7 g/dL (ref 30.0–36.0)
MCV: 87.5 fL (ref 78.0–100.0)
Monocytes Absolute: 1.1 10*3/uL — ABNORMAL HIGH (ref 0.1–1.0)
Monocytes Relative: 10 %
Neutro Abs: 6.9 10*3/uL (ref 1.7–7.7)
Neutrophils Relative %: 65 %
PLATELETS: 316 10*3/uL (ref 150–400)
RBC: 5.46 MIL/uL (ref 4.22–5.81)
RDW: 13.7 % (ref 11.5–15.5)
WBC: 10.6 10*3/uL — AB (ref 4.0–10.5)

## 2016-03-11 LAB — POC OCCULT BLOOD, ED: FECAL OCCULT BLD: NEGATIVE

## 2016-03-11 LAB — LIPASE, BLOOD: LIPASE: 28 U/L (ref 11–51)

## 2016-03-11 NOTE — Telephone Encounter (Signed)
Returned call. Pt voiced understanding of instructions to resume ASA and Brilinta until July 14th.

## 2016-03-11 NOTE — ED Notes (Signed)
Per EMS, patient states he has generalized abdominal pain since 12-16.  Patient was placed on prednisone but the pain has increased.  [facility] but his abdomen has been distended since 12-16. Eating and drinking makes it worse.  Blood in urine and stool since 12-16  BP:172/100 HR:76 R:18

## 2016-03-11 NOTE — Telephone Encounter (Signed)
OK to resume ASA 81 mg and Brilinta until he sees Dr. Tresa EndoKelly back.  Peter SwazilandJordan MD, [facility]

## 2016-03-11 NOTE — Discharge Instructions (Signed)
Read the information below.  You have a moderate amount of stool in your colon. It is important to be on a good bowel regimen. I encourage you to take miralax two times daily until you are having a regular bowel movements. It is important to also eat high fiber foods - such as whole grains, dark green leafy vegetables, fruits, etc. You can also take magnesium citrate to help jump start your bowel regimen - this is a laxative.   Use the prescribed medication as directed.  Please discuss all new medications with your pharmacist.   You stated that [facility] - be sure to call and schedule a follow up appointment with them within one week of your ED visit. You may return to the Emergency Department at any time for worsening condition or any new symptoms that concern you. Return to ED if you are unable to keep fluids down, develop a fever, localized abdominal pain, blood in stool or urine.     Abdominal Pain, Adult Many things can cause belly (abdominal) pain. Most times, the belly pain is not dangerous. Many cases of belly pain can be watched and treated at home. HOME CARE   Do not take medicines that help you go poop (laxatives) unless told to by your doctor.  Only take medicine as told by your doctor.  Eat or drink as told by your doctor. Your doctor will tell you if you should be on a special diet. GET HELP IF:  You do not know what is causing your belly pain.  You have belly pain while you are sick to your stomach (nauseous) or have runny poop (diarrhea).  You have pain while you pee or poop.  Your belly pain wakes you up at night.  You have belly pain that gets worse or better when you eat.  You have belly pain that gets worse when you eat fatty foods.  You have a fever. GET HELP RIGHT AWAY IF:   The pain does not go away within 2 hours.  You keep throwing up (vomiting).  The pain changes and is only in the right or left part of the belly.  You have bloody  or tarry looking poop. MAKE SURE YOU:   Understand these instructions.  Will watch your condition.  Will get help right away if you are not doing well or get worse.   This information is not intended to replace advice given to you by your health care provider. Make sure you discuss any questions you have with your health care provider.   Document Released: 03/02/2008 Document Revised: 10/05/2014 Document Reviewed: 05/24/2013 Elsevier Interactive Patient Education [employer] Elsevier Inc.

## 2016-03-11 NOTE — Telephone Encounter (Signed)
New message      Pt c/o medication issue:  1. Name of Medication: Aspirin and Balanta  2. How are you currently taking this medication (dosage and times per day)? 81 mg and 90 mg   3. Are you having a reaction (difficulty breathing--STAT)? no  4. What is your medication issue? The pt has been off the medications since June 7th for the pt's biopsy and the hospital told the pt to start taking the medications again yesterday after the biopsy, and the pt needed to check with his cardiologist to make sure

## 2016-03-11 NOTE — ED Notes (Signed)
Bed: [id number] Expected date:  Expected time:  Means of arrival:  Comments: 88M/abd pain/blood in stool

## 2016-03-11 NOTE — Telephone Encounter (Signed)
Seeking advice on giving the patient instruction to resume his ASA and Brilinta. He saw Justin Valdez last week -- he was able to go off Brilinta completely until f/u w Dr. Tresa EndoKelly, to consider Plavix on upcoming visit (July 14th)  Per notes: Justin MacadamiaLori C Gerhardt, NP at 03/04/2016  7:36 AM       Status: Signed        Expand All Collapse All   Discussed with Dr. Excell Seltzerooper in the office on June 6th, ok to stop the Brilinta altogether due to upcoming biopsy and hematuria and can stop now.   May consider Plavix on his return visit with Dr. Tresa EndoKelly.   He does not have a stent in place. He needs to keep his follow up with Dr. Tresa EndoKelly as planned.       No instruction on ASA, assume OK to resume but will verify w/ Dr. SwazilandJordan ([facility]).

## 2016-03-11 NOTE — ED Provider Notes (Signed)
  Face-to-face evaluation   History: He is here for evaluation of abdominal pain present for 1 month, and liquid stool present for 2 weeks. He has frequent bowel movements which tend to occur when he eats. He feels like his abdomen swells when he eats. He is not sure if he had receding constipation, prior to onset of the liquid stool. He had a renal biopsy yesterday, to evaluate for nephrotic syndrome. He is been on prednisone for 2 weeks. He denies prior history of ulcer.  Physical exam: Alert, calm, cooperative. Heart regular rate and rhythm, no murmur. Lungs clear anteriorly. Abdomen has normal bowel sounds. The abdomen is soft. There is mild diffuse tenderness. Extremities with edema.     Medical screening examination/treatment/procedure(s) were conducted as a shared visit with non-physician practitioner(s) and myself.  I personally evaluated the patient during the encounter  Mancel BaleElliott Tykwon Fera, MD 03/12/16 2055

## 2016-03-11 NOTE — ED Notes (Signed)
Pt. Stated he went to the bathroom. Pt. Aware of urine specimen. Will collect sample when pt. Goes to the restroom. Nurse aware.

## 2016-03-11 NOTE — ED Provider Notes (Signed)
CSN: [id number]     Arrival date & time 03/11/16  9562 History   First MD Initiated Contact with Patient 03/11/16 [phone]     Chief Complaint  Patient presents with  . Abdominal Pain     (Consider location/radiation/quality/duration/timing/severity/associated sxs/prior Treatment) HPI Comments: Joaopedro Michael [place] is a 30 y.o. male with history of  HLD and nephrotic syndrome on steroids s/p right renal biopsy 03/11/15 presents to ED with complaint of diffuse abdominal pain. Patient has had abdominal pain x 1 month. Pain is generalized, dull constant with intermittent sharp pains, made worse with eating and movement. He has associated nausea and diarrhea x 2 weeks. No vomiting. No fever. No chest pain or SOB. No dysuria, but complains of hematuria. No history of abdominal conditions or abdominal surgeries. He has chronic lower extremity swelling. Denies travel outside [place]. No recent sick contacts with similar symptoms. He has not seen his PCP.  Patient is a 29 y.o. male presenting with abdominal pain. The history is provided by the patient and medical records.  Abdominal Pain Associated symptoms: diarrhea, hematuria and nausea   Associated symptoms: no chest pain and no shortness of breath     Past Medical History  Diagnosis Date  . Myocardial infarction (HCC)     a. STEMI: 100% dist-LAD stenosis treated w/ thrombectomy and PTCA  . HLD (hyperlipidemia)   . Benign essential HTN    Past Surgical History  Procedure Laterality Date  . Skin graft    . Skin graft    . Cardiac catheterization N/A 12/18/2015    Procedure: Left Heart Cath and Coronary Angiography;  Surgeon: Lennette Bihari, MD;  Location: [facility] INVASIVE CV LAB;  Service: Cardiovascular;  Laterality: N/A;  . Cardiac catheterization N/A 12/18/2015    Procedure: Coronary Balloon Angioplasty;  Surgeon: Lennette Bihari, MD;  Location: [facility] INVASIVE CV LAB;  Service: Cardiovascular;  Laterality: N/A;  LAD/atherectomy  . Peripheral vascular  catheterization  12/18/2015    Procedure: Thrombectomy;  Surgeon: Lennette Bihari, MD;  Location: [facility] INVASIVE CV LAB;  Service: Cardiovascular;;   Family History  Problem Relation Age of Onset  . High blood pressure Mother   . Cancer - Other Maternal Grandfather 69    deceased/pancreatic cancer  . High blood pressure Maternal Grandmother   . Heart failure Maternal Grandmother 51    deceased  . Asthma Maternal Grandmother     deceased   Social History  Substance Use Topics  . Smoking status: Never Smoker   . Smokeless tobacco: Former Neurosurgeon  . Alcohol Use: Yes     Comment: Occassional Use    Review of Systems  Constitutional: Positive for diaphoresis ( nighttime).  Respiratory: Negative for shortness of breath.   Cardiovascular: Positive for leg swelling ( b/l). Negative for chest pain.  Gastrointestinal: Positive for nausea, abdominal pain, diarrhea, blood in stool and abdominal distention.  Genitourinary: Positive for hematuria, scrotal swelling and penile pain.  Skin: Positive for color change ( penis).  All other systems reviewed and are negative.     Allergies  Review of patient's allergies indicates no known allergies.  Home Medications   Prior to Admission medications   Medication Sig Start Date End Date Taking? Authorizing Provider  amoxicillin-clavulanate (AUGMENTIN) 875-125 MG tablet Take 1 tablet by mouth 2 (two) times daily.   Yes Historical Provider, MD  aspirin 81 MG chewable tablet Chew 1 tablet (81 mg total) by mouth daily. 12/20/15  Yes Ellsworth Lennox, PA  atorvastatin (  LIPITOR) 80 MG tablet Take 1 tablet (80 mg total) by mouth daily at 6 PM. 12/20/15  Yes Ellsworth Lennox, PA  ezetimibe (ZETIA) 10 MG tablet Take 1 tablet (10 mg total) by mouth daily. 02/04/16  Yes Janetta Hora, PA-C  furosemide (LASIX) 20 MG tablet Take 2 tablets (40 mg total) by mouth daily. 02/05/16  Yes Rosalio Macadamia, NP  losartan (COZAAR) 25 MG tablet Take 1 tablet (25 mg total)  by mouth daily. 12/20/15  Yes Ellsworth Lennox, PA  metoprolol tartrate (LOPRESSOR) 25 MG tablet Take 1 tablet (25 mg total) by mouth 2 (two) times daily. 12/20/15  Yes Ellsworth Lennox, PA  potassium chloride (K-DUR) 10 MEQ tablet Take 1 tablet (10 mEq total) by mouth daily. 02/05/16  Yes Rosalio Macadamia, NP  ticagrelor (BRILINTA) 90 MG TABS tablet Take 1 tablet (90 mg total) by mouth 2 (two) times daily. 12/20/15  Yes Ellsworth Lennox, PA  nitroGLYCERIN (NITROSTAT) 0.4 MG SL tablet Place 1 tablet (0.4 mg total) under the tongue every 5 (five) minutes as needed for chest pain. 12/20/15   Lennart Pall Strader, PA   BP 145/90 mmHg  Pulse 66  Temp(Src) 97.6 F (36.4 C) (Oral)  Resp 16  Ht 5\' 9"  (1.753 m)  Wt 107.956 kg  BMI 35.13 kg/m2  SpO2 98% Physical Exam  Constitutional: He appears well-developed and well-nourished. No distress.  HENT:  Head: Normocephalic and atraumatic.  Mouth/Throat: Oropharynx is clear and moist. No oropharyngeal exudate.  Eyes: Conjunctivae and EOM are normal. Pupils are equal, round, and reactive to light. Right eye exhibits no discharge. Left eye exhibits no discharge. No scleral icterus.  Neck: Normal range of motion. Neck supple.  Cardiovascular: Normal rate, regular rhythm, normal heart sounds and intact distal pulses.   No murmur heard. 2+ lower extremity swelling  Pulmonary/Chest: Effort normal and breath sounds normal. No respiratory distress.  Abdominal: Soft. Bowel sounds are normal. There is tenderness ( diffuse). There is no rebound and no guarding.  Obese appearing abdomen  Genitourinary: Testes normal. Rectal exam shows no external hemorrhoid, no fissure, no mass and no tenderness. Right testis shows no mass, no swelling and no tenderness. Left testis shows no mass, no swelling and no tenderness. Uncircumcised. No penile tenderness. No discharge found.  Chaperone present for duration of exam. No external hemorrhoids, anal fissure, or abscess noted.  Minimal stool in rectal vault. Stool color brown. Uncircumcised male. Hyperpigmentation noted on penis. Foreskin is easily retractable. No TTP of penis or scrotum. No scrotal swelling or masses. No penile discharge.   Musculoskeletal: Normal range of motion.  Lymphadenopathy:    He has no cervical adenopathy.  Neurological: He is alert. Coordination normal.  Skin: Skin is warm and dry. He is not diaphoretic.  Psychiatric: He has a normal mood and affect. His behavior is normal.    ED Course  Procedures (including critical care time) Labs Review Labs Reviewed  COMPREHENSIVE METABOLIC PANEL - Abnormal; Notable for the following:    BUN 23 (*)    Calcium 7.4 (*)    Total Protein 4.0 (*)    Albumin 1.4 (*)    Anion gap 3 (*)    All other components within normal limits  CBC WITH DIFFERENTIAL/PLATELET - Abnormal; Notable for the following:    WBC 10.6 (*)    Monocytes Absolute 1.1 (*)    All other components within normal limits  URINALYSIS, ROUTINE W REFLEX MICROSCOPIC (NOT AT [facility]) -  Abnormal; Notable for the following:    APPearance CLOUDY (*)    Specific Gravity, Urine 1.034 (*)    Hgb urine dipstick MODERATE (*)    Protein, ur >300 (*)    All other components within normal limits  URINE MICROSCOPIC-ADD ON - Abnormal; Notable for the following:    Squamous Epithelial / LPF 0-5 (*)    Bacteria, UA MANY (*)    Casts HYALINE CASTS (*)    All other components within normal limits  LIPASE, BLOOD  POC OCCULT BLOOD, ED    Imaging Review Dg Abd 1 View  03/11/2016  CLINICAL DATA:  Abdominal pain and diarrhea for several days EXAM: ABDOMEN - 1 VIEW COMPARISON:  None. FINDINGS: There is moderate stool throughout the colon. There is no bowel dilatation or air-fluid level suggesting obstruction. No free air. There is a tiny phlebolith in left pelvis. IMPRESSION: Moderate stool throughout colon.  Bowel gas pattern unremarkable. Electronically Signed   By: Bretta BangWilliam  Woodruff III M.D.   On:  03/11/2016 11:14   [place] Biopsy  03/10/2016  INDICATION: 30 year old male with proteinuria and possible nephritic/nephrotic syndrome. EXAM: ULTRASOUND BIOPSY CORE LIVER MEDICATIONS: None. ANESTHESIA/SEDATION: Moderate (conscious) sedation was employed during this procedure. A total of Versed 1.5 mg and Fentanyl 75 mcg was administered intravenously. Moderate Sedation Time: 15 minutes. The patient's level of consciousness and vital signs were monitored continuously by radiology nursing throughout the procedure under my direct supervision. FLUOROSCOPY TIME:  None COMPLICATIONS: None immediate. Estimated blood loss: None PROCEDURE: Informed written consent was obtained from the patient after a thorough discussion of the procedural risks, benefits and alternatives. All questions were addressed. A timeout was performed prior to the initiation of the procedure. Both flanks were interrogated with ultrasound. The lower pole the right kidney is more visible and easily accessible. An appropriate skin entry site was selected and marked. The region was sterilely prepped and draped in standard fashion using chlorhexidine skin prep. Local anesthesia was attained by infiltration with 1% lidocaine. A small dermatotomy was made. Under real-time sonographic guidance, multiple 16 gauge biopsies were obtained using the Bard automated biopsy device. Biopsy specimens were placed in saline and delivered to pathology for further analysis. Ultrasound short adequate placement of the needle within the lower pole cortex on all passes. Post biopsy ultrasound imaging demonstrates no evidence of perinephric hematoma or active hemorrhage. The patient tolerated the procedure well. IMPRESSION: Technically successful ultrasound-guided core biopsy of the right kidney. Electronically Signed   By: Malachy MoanHeath  McCullough M.D.   On: 03/10/2016 13:09   I have personally reviewed and evaluated these images and lab results as part of my medical  decision-making.   EKG Interpretation None      MDM   Final diagnoses:  Generalized abdominal pain    Patient is afebrile and non-toxic appearing. He is resting in bed. Blood pressure is mildly elevated; vital signs otherwise stable. Abdomen is obese with positive bowel sounds. Mild diffuse TTP of abdomen. Fecal occult negative. WBC improving, LFTs and bilirubin reassuring, lipase normal. Doubt pancreatitis or acute cholecystitis. U/A improving from previous, negative leukocytes and nitrites; some bacteria and some squamous epithelial cells - doubt UTI, will culture.  Protein, calcium, and albumin lower; however, review of previous records indicate this is consistent. KUB of abdomen to evaluate stool burden - moderate stool burden, no indication of obstruction. ?constipation etiology for abdominal pain.    Discussed results with patient. Educated on good bowel regimen to help with constipation. Encouraged follow  up with PCP. Continue follow up with cardiology and nephrology. Provided return precautions. Patient voiced understanding and is agreeable.     Lona Kettle, PA-C 03/11/16 1439  Mancel Bale, MD 03/12/16 2055

## 2016-03-12 ENCOUNTER — Telehealth: Payer: Self-pay | Admitting: Cardiovascular Disease

## 2016-03-12 LAB — URINE CULTURE: CULTURE: NO GROWTH

## 2016-03-12 NOTE — Telephone Encounter (Signed)
Received records from [facility] for appointment on 04/10/16 with Dr Tresa EndoKelly.  [facility] Dr Landry DykeKelly's schedule on 04/10/16. lp

## 2016-03-17 ENCOUNTER — Encounter (HOSPITAL_COMMUNITY): Payer: Self-pay

## 2016-03-19 ENCOUNTER — Ambulatory Visit (HOSPITAL_COMMUNITY): Admission: RE | Admit: 2016-03-19 | Payer: BLUE CROSS/BLUE SHIELD | Source: Ambulatory Visit

## 2016-03-19 ENCOUNTER — Other Ambulatory Visit: Payer: Self-pay | Admitting: Student

## 2016-04-10 ENCOUNTER — Ambulatory Visit: Payer: BLUE CROSS/BLUE SHIELD | Admitting: Cardiovascular Disease

## 2016-04-17 ENCOUNTER — Other Ambulatory Visit: Payer: Self-pay | Admitting: Student

## 2016-06-23 ENCOUNTER — Other Ambulatory Visit: Payer: Self-pay | Admitting: Student

## 2016-11-12 ENCOUNTER — Other Ambulatory Visit: Payer: Self-pay | Admitting: Physician Assistant

## 2017-02-09 ENCOUNTER — Other Ambulatory Visit: Payer: Self-pay | Admitting: Physician Assistant

## 2017-03-08 ENCOUNTER — Other Ambulatory Visit: Payer: Self-pay | Admitting: Student

## 2017-03-18 ENCOUNTER — Other Ambulatory Visit: Payer: Self-pay | Admitting: Student

## 2017-04-23 ENCOUNTER — Other Ambulatory Visit: Payer: Self-pay | Admitting: Cardiovascular Disease

## 2017-06-14 ENCOUNTER — Other Ambulatory Visit: Payer: Self-pay | Admitting: Student

## 2017-06-14 ENCOUNTER — Other Ambulatory Visit: Payer: Self-pay | Admitting: Cardiovascular Disease

## 2017-06-15 NOTE — Telephone Encounter (Signed)
This is Dr. Kelly's pt. °

## 2017-07-17 ENCOUNTER — Other Ambulatory Visit: Payer: Self-pay | Admitting: Student

## 2017-07-19 NOTE — Telephone Encounter (Signed)
REFILL 

## 2017-07-19 NOTE — Telephone Encounter (Signed)
This is Dr. Kelly's pt. °

## 2017-08-14 ENCOUNTER — Other Ambulatory Visit: Payer: Self-pay | Admitting: Cardiovascular Disease

## 2017-09-09 ENCOUNTER — Other Ambulatory Visit: Payer: Self-pay | Admitting: Cardiovascular Disease

## 2017-09-13 IMAGING — US US BIOPSY
1 series · 9 of 9 positions shown · non-contrast
Comparison: none

INDICATION: 30-year-old male with proteinuria and possible nephritic/nephrotic
syndrome.

[Series 1: us biopsy · 0.28mm/px · 9 acquisitions, 9 frames shown]
[im 1/9]
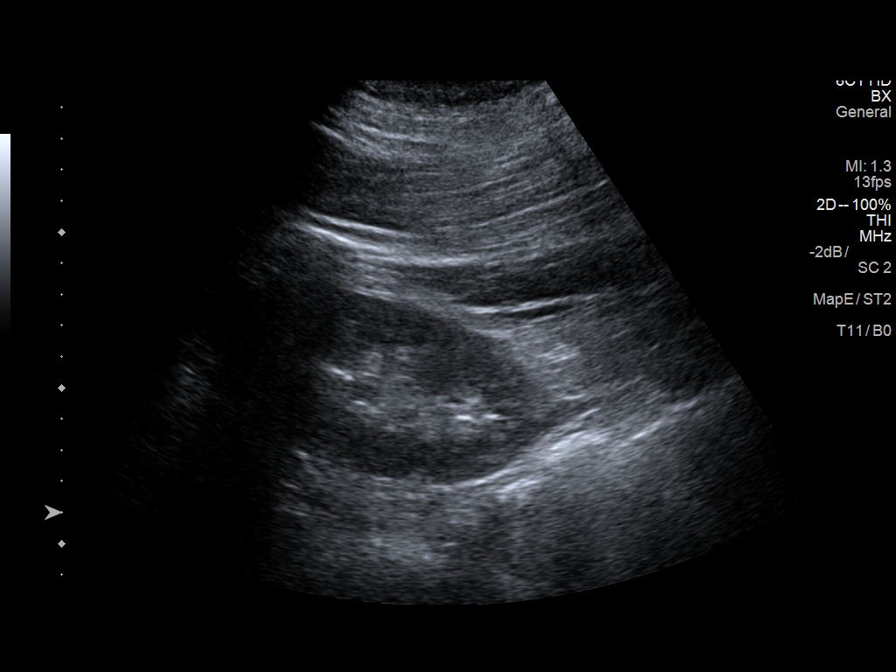
[im 2/9]
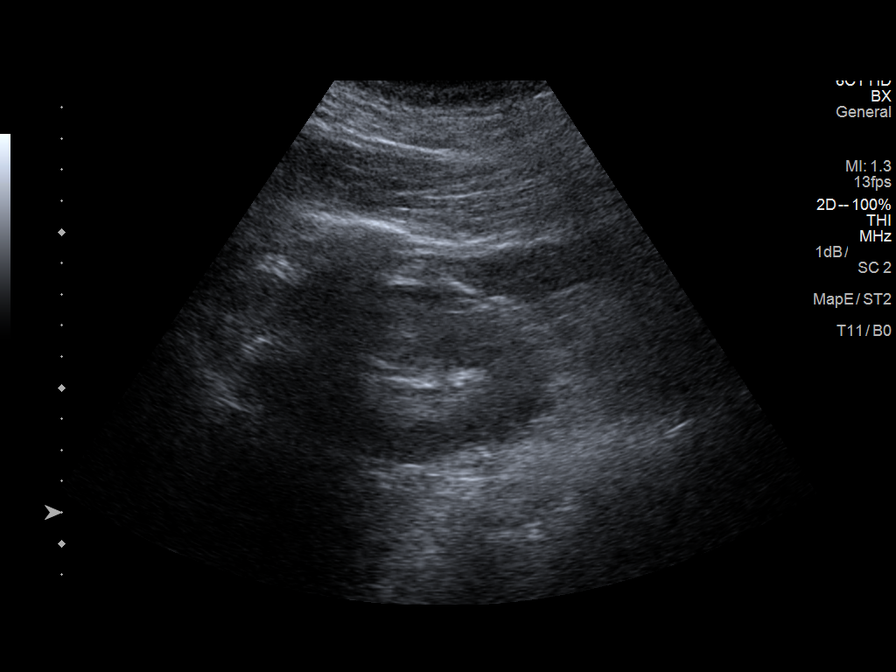
[im 3/9]
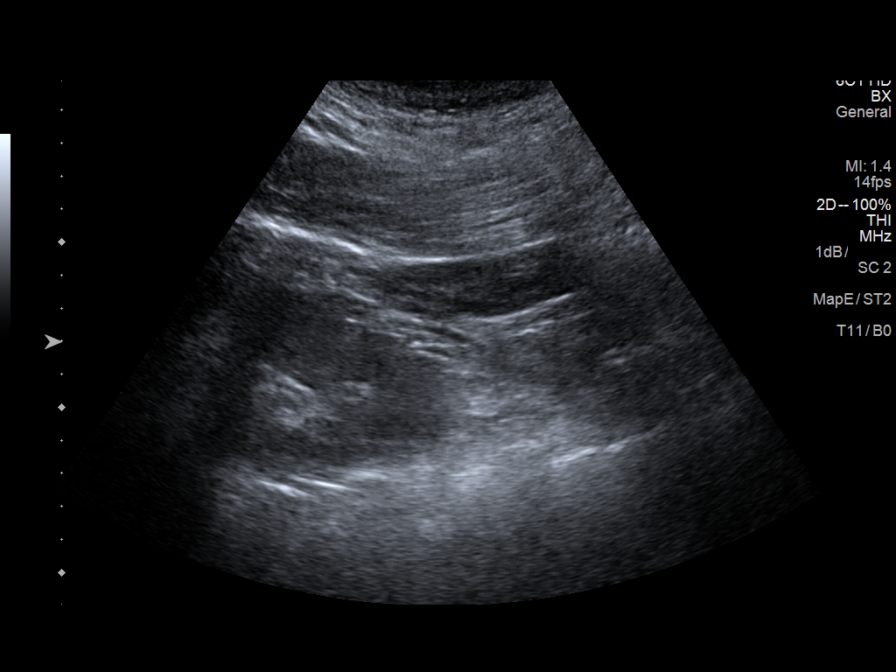
[im 4/9]
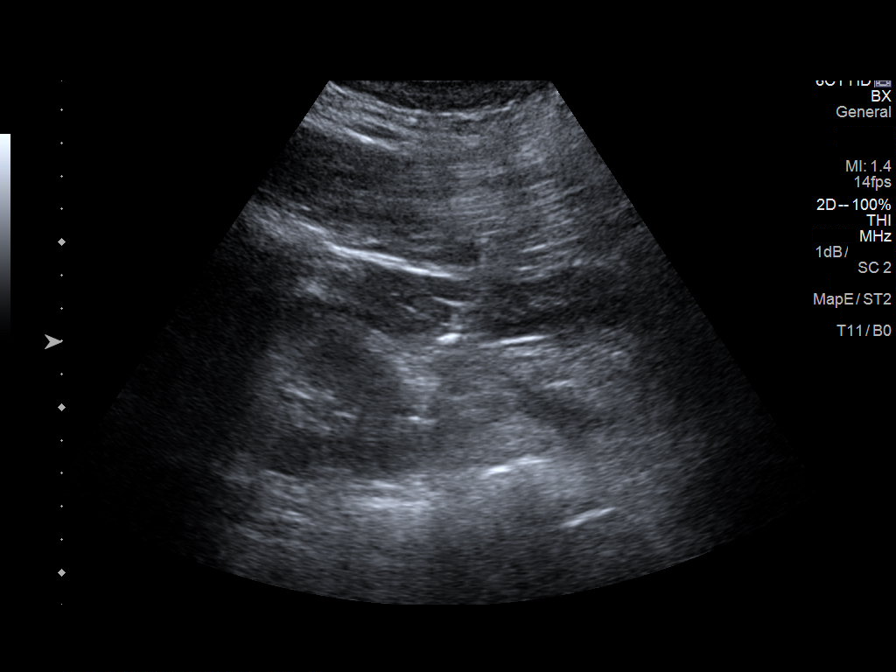
[im 5/9]
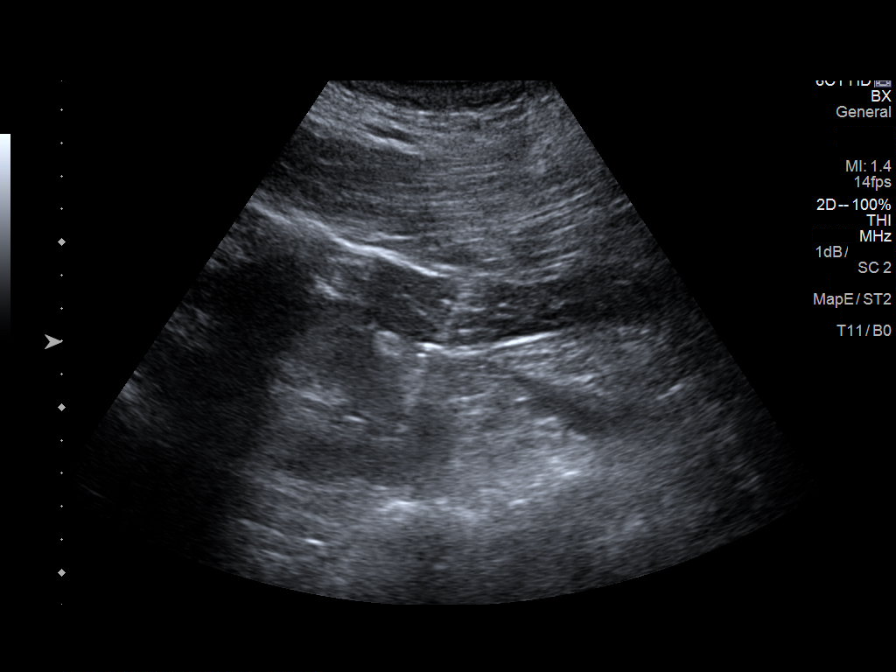
[im 6/9]
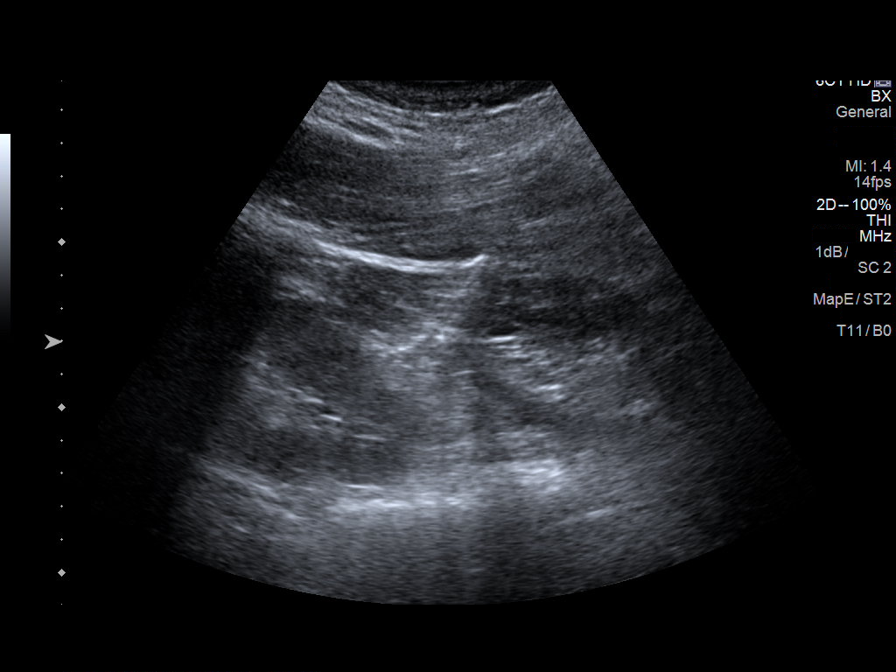
[im 7/9]
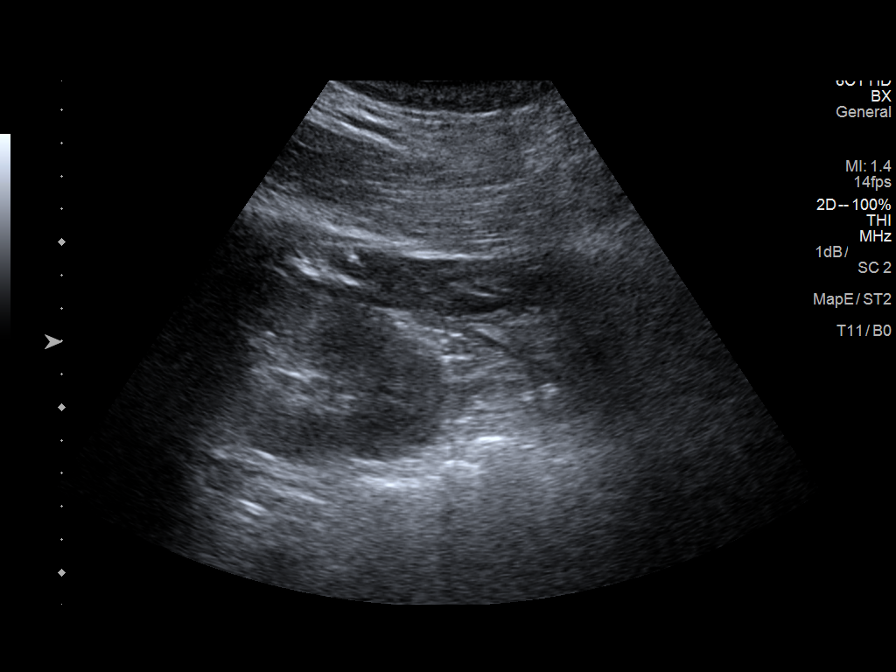
[im 8/9]
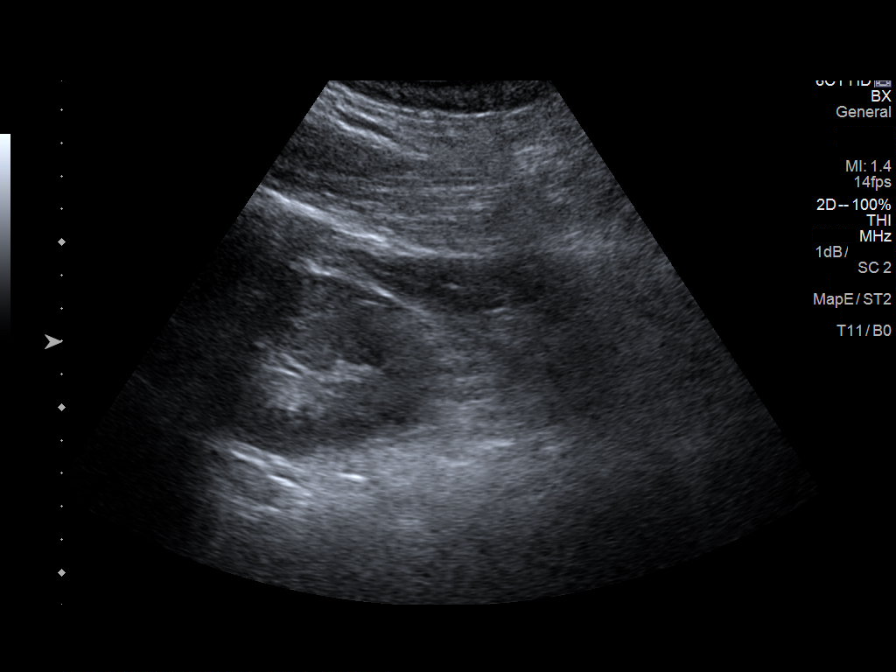
[im 9/9]
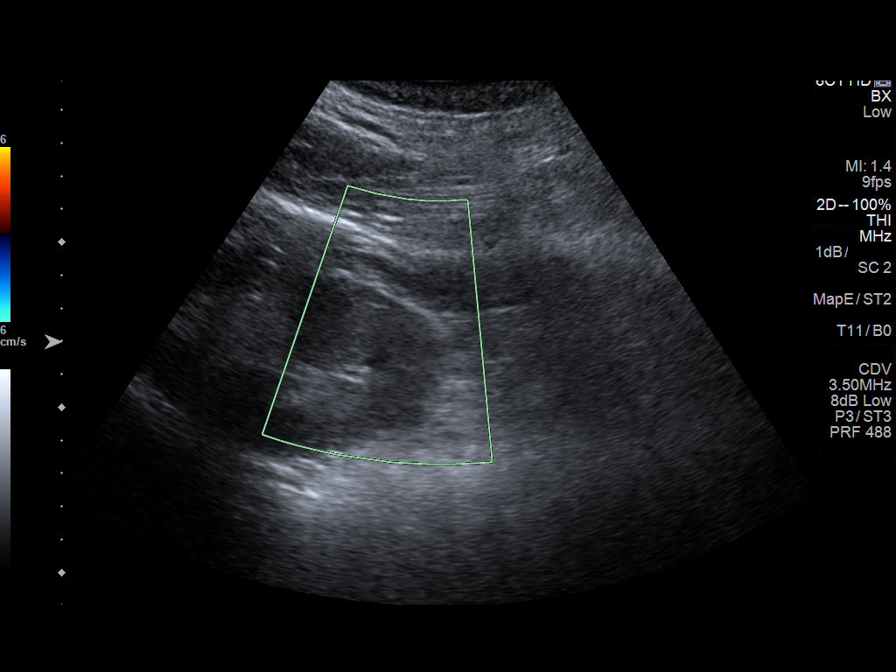

[9 of 9 positions shown; findings below may reference images not displayed]

EXAM:
ULTRASOUND BIOPSY CORE LIVER

MEDICATIONS:
None.

ANESTHESIA/SEDATION:
Moderate (conscious) sedation was employed during this procedure. A
total of Versed 1.5 mg and Fentanyl 75 mcg was administered
intravenously.

Moderate Sedation Time: 15 minutes. The patient's level of
consciousness and vital signs were monitored continuously by
radiology nursing throughout the procedure under my direct
supervision.

FLUOROSCOPY TIME:  None

COMPLICATIONS:
None immediate.

Estimated blood loss: None

PROCEDURE:
Informed written consent was obtained from the patient after a
thorough discussion of the procedural risks, benefits and
alternatives. All questions were addressed. A timeout was performed
prior to the initiation of the procedure.

Both flanks were interrogated with ultrasound. The lower pole the
right kidney is more visible and easily accessible. An appropriate
skin entry site was selected and marked. The region was sterilely
prepped and draped in standard fashion using chlorhexidine skin
prep. Local anesthesia was attained by infiltration with 1%
lidocaine. A small dermatotomy was made.

Under real-time sonographic guidance, multiple 16 gauge biopsies
were obtained using the Lord automated biopsy device. Biopsy
specimens were placed in saline and delivered to pathology for
further analysis. Ultrasound short adequate placement of the needle
within the lower pole cortex on all passes. Post biopsy ultrasound
imaging demonstrates no evidence of perinephric hematoma or active
hemorrhage. The patient tolerated the procedure well.
IMPRESSION: Technically successful ultrasound-guided core biopsy of the right
kidney.

## 2017-10-28 ENCOUNTER — Other Ambulatory Visit: Payer: Self-pay | Admitting: Student

## 2017-10-29 NOTE — Telephone Encounter (Signed)
REFILL 

## 2017-12-06 ENCOUNTER — Other Ambulatory Visit: Payer: Self-pay | Admitting: Student

## 2017-12-08 ENCOUNTER — Other Ambulatory Visit: Payer: Self-pay | Admitting: Student

## 2017-12-11 IMAGING — CR DG CHEST 2V
2 series · 2 of 2 positions shown · non-contrast
Comparison: None.

CLINICAL DATA: Bilateral lower extremity swelling, worse on the
right.

EXAM:
CHEST  2 VIEW

[chest pa]
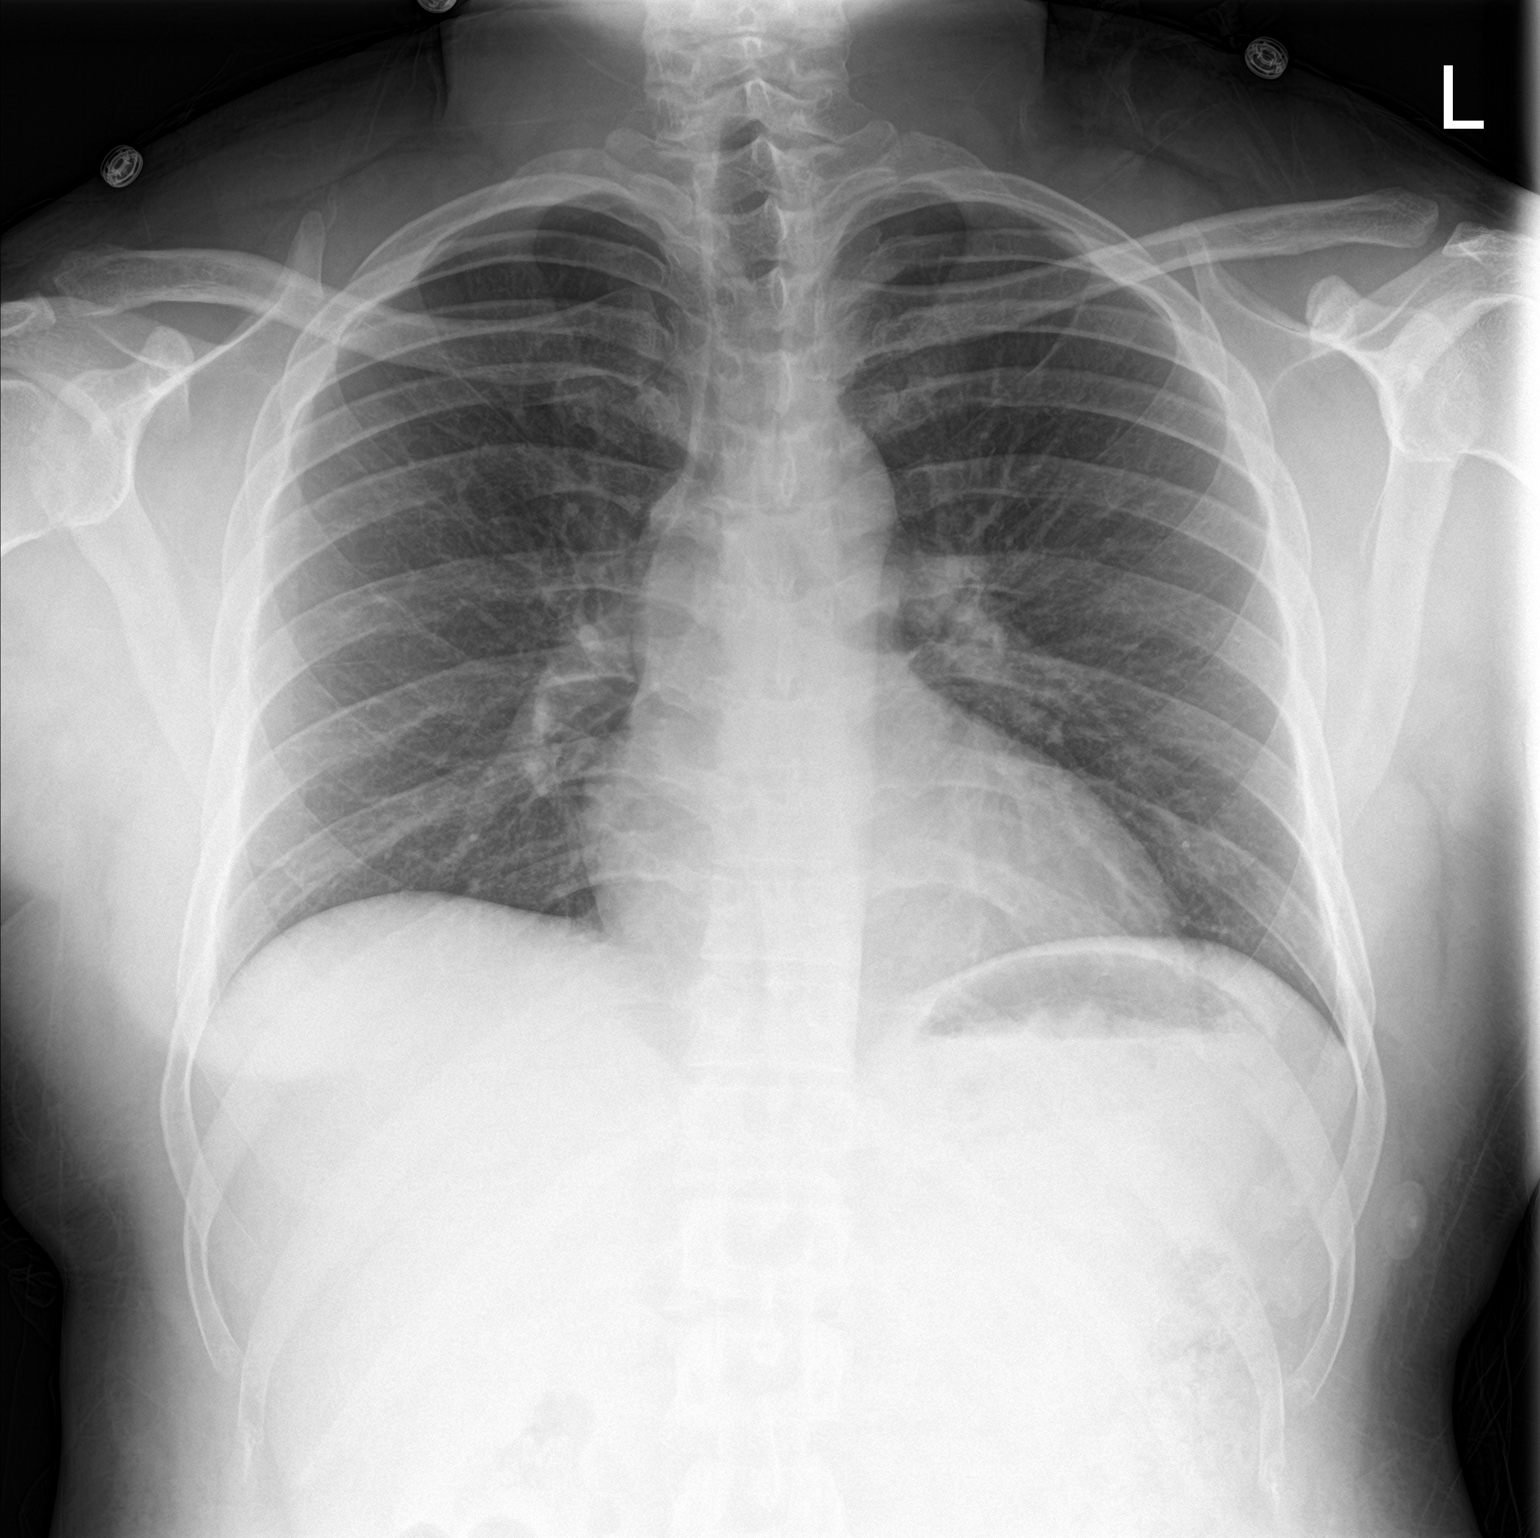

[chest lat]
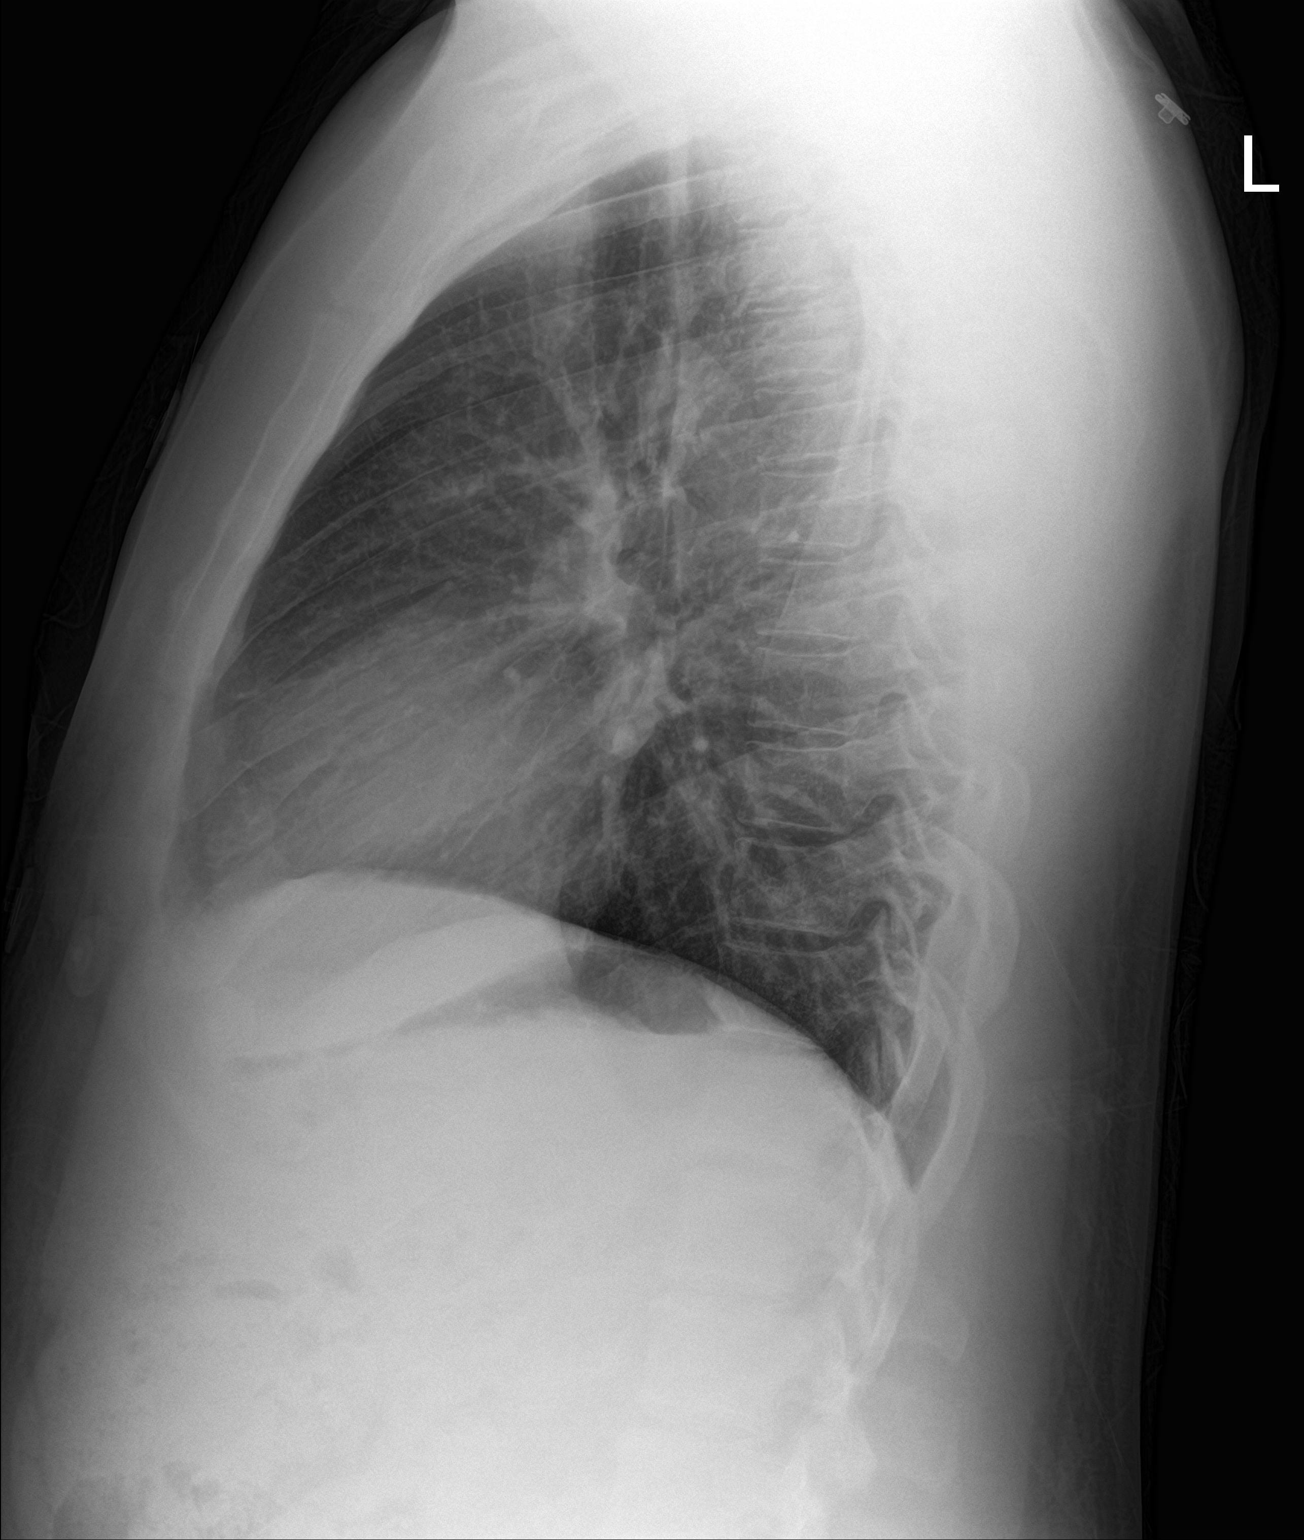

[2 of 2 positions shown; findings below may reference images not displayed]

FINDINGS: The heart size and mediastinal contours are within normal limits.
Both lungs are clear. The visualized skeletal structures are
unremarkable.
IMPRESSION: No active cardiopulmonary disease.

## 2018-02-10 ENCOUNTER — Other Ambulatory Visit: Payer: Self-pay | Admitting: Student

## 2018-08-12 ENCOUNTER — Encounter (HOSPITAL_COMMUNITY): Payer: Self-pay | Admitting: Emergency Medicine

## 2018-08-12 ENCOUNTER — Emergency Department (HOSPITAL_COMMUNITY)
Admission: EM | Admit: 2018-08-12 | Discharge: 2018-08-12 | Disposition: A | Discharge status: Home / Self Care | Payer: 59 | Attending: Emergency Medicine | Admitting: Emergency Medicine

## 2018-08-12 ENCOUNTER — Emergency Department (HOSPITAL_COMMUNITY): Payer: 59

## 2018-08-12 ENCOUNTER — Other Ambulatory Visit: Payer: Self-pay

## 2018-08-12 DIAGNOSIS — I10 Essential (primary) hypertension: Secondary | ICD-10-CM | POA: Diagnosis not present

## 2018-08-12 DIAGNOSIS — E876 Hypokalemia: Secondary | ICD-10-CM

## 2018-08-12 DIAGNOSIS — I252 Old myocardial infarction: Secondary | ICD-10-CM | POA: Diagnosis not present

## 2018-08-12 DIAGNOSIS — M25571 Pain in right ankle and joints of right foot: Secondary | ICD-10-CM | POA: Diagnosis present

## 2018-08-12 DIAGNOSIS — Z79899 Other long term (current) drug therapy: Secondary | ICD-10-CM | POA: Insufficient documentation

## 2018-08-12 HISTORY — DX: Nephrotic syndrome with unspecified morphologic changes: N04.9

## 2018-08-12 LAB — I-STAT CHEM 8, ED
BUN: 65 mg/dL — ABNORMAL HIGH (ref 6–20)
CHLORIDE: 95 mmol/L — AB (ref 98–111)
CREATININE: 2.1 mg/dL — AB (ref 0.61–1.24)
Calcium, Ion: 1.05 mmol/L — ABNORMAL LOW (ref 1.15–1.40)
GLUCOSE: 105 mg/dL — AB (ref 70–99)
HEMATOCRIT: 30 % — AB (ref 39.0–52.0)
Hemoglobin: 10.2 g/dL — ABNORMAL LOW (ref 13.0–17.0)
POTASSIUM: 2.9 mmol/L — AB (ref 3.5–5.1)
Sodium: 137 mmol/L (ref 135–145)
TCO2: 29 mmol/L (ref 22–32)

## 2018-08-12 MED ORDER — PREDNISONE 20 MG PO TABS: 40.0000 mg | ORAL_TABLET | Freq: Every day | ORAL | 0 refills | Status: DC

## 2018-08-12 MED ORDER — HYDROMORPHONE HCL 1 MG/ML IJ SOLN
2.0000 mg | Freq: Once | INTRAMUSCULAR | Status: AC
  Administered 2018-08-12: 2 mg via INTRAMUSCULAR
  Filled 2018-08-12: qty 2

## 2018-08-12 MED ORDER — POTASSIUM CHLORIDE CRYS ER 20 MEQ PO TBCR
40.0000 meq | EXTENDED_RELEASE_TABLET | Freq: Once | ORAL | Status: AC
  Administered 2018-08-12: 40 meq via ORAL
  Filled 2018-08-12: qty 2

## 2018-08-12 MED ORDER — HYDROCODONE-ACETAMINOPHEN 5-325 MG PO TABS
1.0000 | ORAL_TABLET | Freq: Once | ORAL | Status: AC
  Administered 2018-08-12: 1 via ORAL
  Filled 2018-08-12: qty 1

## 2018-08-12 MED ORDER — PREDNISONE 20 MG PO TABS
60.0000 mg | ORAL_TABLET | Freq: Once | ORAL | Status: AC
  Administered 2018-08-12: 60 mg via ORAL
  Filled 2018-08-12: qty 3

## 2018-08-12 MED ORDER — HYDROCODONE-ACETAMINOPHEN 5-325 MG PO TABS: 1.0000 | ORAL_TABLET | Freq: Four times a day (QID) | ORAL | 0 refills | Status: AC | PRN

## 2018-08-12 MED ORDER — PREDNISONE 20 MG PO TABS: 40.0000 mg | ORAL_TABLET | Freq: Every day | ORAL | 0 refills | Status: AC

## 2018-08-12 NOTE — ED Provider Notes (Signed)
Medical screening examination/treatment/procedure(s) were conducted as a shared visit with non-physician practitioner(s) and myself.  I personally evaluated the patient during the encounter.  None  Patient seen by me along with the physician assistant.  Patient with a complaint of right ankle pain.  No known history of injury.  No past history of gout.  Patient states the pain is severe just at the ankle.  There is no proximal calf or leg pain.  No evidence of any bruising there is some swelling no redness.  The right ankle dorsalis pedis pulses 2+ no proximal fibula tenderness.  No calf tenderness.  Is there is swelling around the ankle there is no erythema.  There is some increased warmth.  Patient has a history of nephrotic syndrome.  So we will check his kidney function.  Creatinine came back at 2.1.  Patient's potassium a little low at 2.9 this could be from hyperventilation we will give 40 mEq of potassium here but not send him home on potassium.  We will give 60 mg of prednisone here we will send on home on 40 mg prednisone for the next 5 days.  We will also give a shot of IM Dilaudid here to help with pain.  Send him home with hydrocodone.  Have him follow-up with his doctors.  Patient unable to weight-bear on it so will give crutches and will give an ASO.  Essentially we will treat this as if it could be an injury or could be gout.  No concern for DVT.   Vanetta MuldersZackowski, Emorie Mcfate, MD 08/12/18 2052

## 2018-08-12 NOTE — ED Notes (Signed)
Out to xray

## 2018-08-12 NOTE — ED Provider Notes (Signed)
[facility] Provider Note   CSN: [id number] Arrival date & time: 08/12/18  1834   History   Chief Complaint Chief Complaint  Patient presents with  . Ankle Injury    HPI Justin Valdez is a 32 y.o. male with past medical history significant for hypertension, hyperlipidemia, nephrotic syndrome who presents for evaluation of right ankle pain.  Patient states he woke up yesterday morning and noticed swelling to his right lateral malleolus.  Patient states that he noticed pain, however was able to walk on it.  Patient states he woke up this morning and noticed 8/10 pain to his right ankle.  Patient states he has not been able to bear weight on it.  Admits to mild swelling to his right lateral malleolus.  Denies erythema or warmth to his lower extremities.  Denies fever, chills, numbness or tingling in his extremities.  Patient states he does not know if he injured his ankle.  Denies history of IV drug use or history of gout.  Denies calf swelling, erythema or warmth.  History obtained from patient.  No interpreter was used.  HPI  Past Medical History:  Diagnosis Date  . Benign essential HTN   . HLD (hyperlipidemia)   . Myocardial infarction (HCC)    a. STEMI: 100% dist-LAD stenosis treated w/ thrombectomy and PTCA  . Nephrotic syndrome     Patient Active Problem List   Diagnosis Date Noted  . HLD (hyperlipidemia) 12/19/2015  . Hypokalemia 12/19/2015  . ST elevation (STEMI) myocardial infarction involving left anterior descending coronary artery (HCC) 12/18/2015  . ST elevation myocardial infarction involving left anterior descending (LAD) coronary artery [facility](HCC)     Past Surgical History:  Procedure Laterality Date  . CARDIAC CATHETERIZATION N/A 12/18/2015   Procedure: Left Heart Cath and Coronary Angiography;  Surgeon: Lennette Biharihomas A Kelly, MD;  [facility];  Service: Cardiovascular;  Laterality: N/A;  . CARDIAC  CATHETERIZATION N/A 12/18/2015   Procedure: Coronary Balloon Angioplasty;  Surgeon: Lennette Biharihomas A Kelly, MD;  Location: [facility] INV[facility] CV LAB;  Service: Cardiovascular;  Laterality: N/A;  LAD/atherectomy  . PERIPHERAL VASCULAR CATHETERIZATION  12/18/2015   Procedure: Thrombectomy;  Surgeon: Lennette Biharihomas A Kelly, MD;  [facility];  Service: Cardiovascular;;  . SKIN GRAFT    . SKIN GRAFT          Home Medications    Prior to Admission medications   Medication Sig Start Date End Date Taking? Authorizing Provider  amoxicillin-clavulanate (AUGMENTIN) 875-125 MG tablet Take 1 tablet by mouth 2 (two) times daily.    [provider]  aspirin 81 MG chewable tablet Chew 1 tablet (81 mg total) by mouth daily. 12/20/15   Strader, Lennart PallBrittany M, PA-C  atorvastatin (LIPITOR) 80 MG tablet TAKE 1 TABLET(80 MG) BY MOUTH DAILY AT 6 PM 12/08/17   Lennette BihariKelly, Thomas A, MD  BRILINTA 90 MG TABS tablet TAKE 1 TABLET BY MOUTH TWICE A DAY 03/19/16   Strader, GrenadaBrittany M, PA-C  ezetimibe (ZETIA) 10 MG tablet TAKE 1 TABLET BY MOUTH DAILY 08/16/17   Lennette BihariKelly, Thomas A, MD  ezetimibe (ZETIA) 10 MG tablet TAKE 1 TABLET(10 MG) BY MOUTH DAILY 09/09/17   Lennette BihariKelly, Thomas A, MD  furosemide (LASIX) 20 MG tablet Take 2 tablets (40 mg total) by mouth daily. 02/05/16   Rosalio MacadamiaGerhardt, Lori C, NP  HYDROcodone-acetaminophen (NORCO/VICODIN) 5-325 MG tablet Take 1-2 tablets by mouth every 6 (six) hours as needed for up to 3 days for  severe pain. 08/12/18 08/15/18  Kailon Treese A, PA-C  losartan (COZAAR) 25 MG tablet Take 1 tablet (25 mg total) by mouth daily. 12/20/15   Strader, Lennart Pall, PA-C  metoprolol tartrate (LOPRESSOR) 50 MG tablet Take 1 tablet (50 mg total) by mouth 2 (two) times daily. NEED OV. 10/29/17   Strader, Lennart Pall, PA-C  nitroGLYCERIN (NITROSTAT) 0.4 MG SL tablet Place 1 tablet (0.4 mg total) under the tongue every 5 (five) minutes as needed for chest pain. 12/20/15   Strader, Lennart Pall, PA-C  potassium chloride (K-DUR) 10  MEQ tablet Take 1 tablet (10 mEq total) by mouth daily. 02/05/16   Rosalio Macadamia, NP  predniSONE (DELTASONE) 20 MG tablet Take 2 tablets (40 mg total) by mouth daily for 5 days. 08/12/18 08/17/18  Renleigh Ouellet A, PA-C    Family History Family History  Problem Relation Age of Onset  . High blood pressure Mother   . Cancer - Other Maternal Grandfather 56       deceased/pancreatic cancer  . High blood pressure Maternal Grandmother   . Heart failure Maternal Grandmother 51       deceased  . Asthma Maternal Grandmother        deceased    Social History Social History   Tobacco Use  . Smoking status: Never Smoker  . Smokeless tobacco: Former Engineer, water Use Topics  . Alcohol use: Yes    Comment: Occassional Use  . Drug use: No     Allergies   Patient has no known allergies.   Review of Systems Review of Systems  Constitutional: Negative.   Respiratory: Negative for shortness of breath.   Cardiovascular: Negative.   Gastrointestinal: Negative.   Musculoskeletal:       Right ankle pain.  Skin: Negative.   All other systems reviewed and are negative.    Physical Exam Updated Vital Signs BP 134/62 (BP Location: Right Arm)   Pulse 81   Temp 98.3 F (36.8 C) (Oral)   Ht 5\' 8"  (1.727 m)   Wt 99.8 kg   SpO2 99%   BMI 33.45 kg/m   Physical Exam  Constitutional: He appears well-developed and well-nourished. No distress.  HENT:  Head: Atraumatic.  Eyes: Pupils are equal, round, and reactive to light.  Neck: Normal range of motion. Neck supple.  Cardiovascular: Normal rate, regular rhythm, normal heart sounds and intact distal pulses. Exam reveals no friction rub.  No murmur heard. Pulmonary/Chest: Effort normal and breath sounds normal. No stridor. No respiratory distress. He has no wheezes.  Abdominal: Soft. Bowel sounds are normal. He exhibits no distension.  Musculoskeletal: Normal range of motion.  Will range of motion left lower extremity.  Patient  unwilling to perform range of motion exercises with right lower extremity secondary to pain.  Tenderness to palpation to medial malleoli on right lower extremity.  Unwilling to bear weight secondary to pain.  Neurological: He is alert.  Neurovascularly intact.  Skin: Skin is warm and dry. He is not diaphoretic.  Mild edema to right lateral malleolus.  No erythema or warmth.  No rashes or lesions.  Psychiatric: He has a normal mood and affect.  Nursing note and vitals reviewed.    ED Treatments / Results  Labs (all labs ordered are listed, but only abnormal results are displayed) Labs Reviewed  I-STAT CHEM 8, ED - Abnormal; Notable for the following components:      Result Value   Potassium 2.9 (*)    Chloride  95 (*)    BUN 65 (*)    Creatinine, Ser 2.10 (*)    Glucose, Bld 105 (*)    Calcium, Ion 1.05 (*)    Hemoglobin 10.2 (*)    HCT 30.0 (*)    All other components within normal limits    EKG None  Radiology Dg Ankle Complete Right  Result Date: 08/12/2018 CLINICAL DATA:  Right ankle pain x2 days, no trauma EXAM: RIGHT ANKLE - COMPLETE 3+ VIEW COMPARISON:  None. FINDINGS: No fracture or dislocation is seen. The ankle mortise is intact. The base of the fifth metatarsal is unremarkable. The visualized soft tissues are unremarkable. IMPRESSION: Negative. Electronically Signed   By: Charline Bills M.D.   On: 08/12/2018 19:40    Procedures Procedures (including critical care time)  Medications Ordered in ED Medications  predniSONE (DELTASONE) tablet 60 mg (has no administration in time range)  HYDROmorphone (DILAUDID) injection 2 mg (has no administration in time range)  potassium chloride SA (K-DUR,KLOR-CON) CR tablet 40 mEq (has no administration in time range)  HYDROcodone-acetaminophen (NORCO/VICODIN) 5-325 MG per tablet 1 tablet (1 tablet Oral Given 08/12/18 1913)     Initial Impression / Assessment and Plan / ED Course  I have reviewed the triage vital signs  and the nursing notes.  Pertinent labs & imaging results that were available during my care of the patient were reviewed by me and considered in my medical decision making (see chart for details).  32 year old male who appears otherwise well presents for evaluation of right ankle pain.  Unknown if injury to right ankle.  Patient states he is unable to bear weight on right ankle secondary to pain.  Patient unwilling to perform range of motion exercises to right lower extremity secondary to pain.  No erythema or warmth to right lower extremity.  Mild soft tissue swelling to right lateral malleoli.  Neurovascularly intact.  Denies history of IV drug use.  No edema, erythema or warmth to bilateral calves.  Will obtain plain film to rule out fracture.  X-ray negative for fracture dislocation.  On reevaluation patient states he has continued pain.  Discussed patient with my attending, Dr. Deretha Emory.  He will evaluate patient with recommendation to check kidney function given history of nephrotic syndrome.  Patient is being followed by this with [facility] neph[facility].  Labs with Potassium at 2.9, creatinine 2.1, patient's last creatinine was 1.9 on 03/2018, followed by [facility] nephrology for nephrotic syndrome.  Exam most consistent with ligament sprain, strain or gout episode.  Will treat with prednisone and pain management.  Given hypokalemia will replace potassium while in department.  Given his nephrotic syndrome will not send home with potassium supplementation.  Discussed with patient follow-up with PCP for hypokalemia as well as his ankle pain.  Low suspicion for emergent pathology causing patient's pain.  Low suspicion for septic joint, cellulitis, hemarthrosis, fracture or dislocation, DVT.  Discussed return precautions with patient.  Patient is hemodynamically stable and appropriate for DC home at this time.  Patient voiced understanding and is agreeable for follow-up.  Patient was seen and evaluated by my  attending, Dr. Deretha Emory, agrees with above treatment, plan and disposition of patient.    Final Clinical Impressions(s) / ED Diagnoses   Final diagnoses:  Hypokalemia  Acute right ankle pain    ED Discharge Orders         Ordered    predniSONE (DELTASONE) 20 MG tablet  Daily     08/12/18 2042  HYDROcodone-acetaminophen (NORCO/VICODIN) 5-325 MG tablet  Every 6 hours PRN     08/12/18 2042           Gayna Braddy A, PA-C 08/12/18 2103    Vanetta Mulders, MD 08/16/18 [phone]

## 2018-08-12 NOTE — ED Notes (Signed)
Patient transported to X-ray 

## 2018-08-12 NOTE — Discharge Instructions (Signed)
You were evaluated today for ankle pain.  Your x-ray was negative for fracture dislocation.  It is possible you have sprained your ankle or you are having a gout episode.  We treat this with steroids and pain medicine.  We have given you crutches as well as placed you in an ankle brace.  Please follow-up with your primary care provider on Monday for reevaluation.  Return to the ED for any new or worsening symptoms.

## 2018-08-12 NOTE — ED Triage Notes (Signed)
Per pt he woke up yesterday morning and had some swelling to right ankle and not able to put any pressure on at all. Pt stated he has nephrotic syndrome.

## 2018-08-12 NOTE — ED Notes (Signed)
Back from xray

## 2019-08-07 ENCOUNTER — Other Ambulatory Visit: Payer: Self-pay | Admitting: Gerontology

## 2019-08-09 ENCOUNTER — Telehealth: Payer: Self-pay | Admitting: *Deleted

## 2019-08-09 ENCOUNTER — Other Ambulatory Visit: Payer: Self-pay | Admitting: Gerontology

## 2019-08-09 DIAGNOSIS — I2510 Atherosclerotic heart disease of native coronary artery without angina pectoris: Secondary | ICD-10-CM

## 2019-08-09 DIAGNOSIS — I252 Old myocardial infarction: Secondary | ICD-10-CM

## 2019-08-16 ENCOUNTER — Ambulatory Visit
Admission: RE | Admit: 2019-08-16 | Discharge: 2019-08-16 | Disposition: A | Discharge status: Home / Self Care | Payer: 59 | Source: Ambulatory Visit | Attending: Gerontology | Admitting: Gerontology

## 2019-08-16 ENCOUNTER — Other Ambulatory Visit: Payer: Self-pay

## 2019-08-16 DIAGNOSIS — I252 Old myocardial infarction: Secondary | ICD-10-CM | POA: Diagnosis present

## 2019-08-16 DIAGNOSIS — I2510 Atherosclerotic heart disease of native coronary artery without angina pectoris: Secondary | ICD-10-CM | POA: Insufficient documentation

## 2019-09-07 ENCOUNTER — Other Ambulatory Visit (HOSPITAL_COMMUNITY): Payer: Self-pay | Admitting: Gerontology

## 2019-09-07 ENCOUNTER — Other Ambulatory Visit: Payer: Self-pay | Admitting: Gerontology

## 2019-09-07 DIAGNOSIS — N049 Nephrotic syndrome with unspecified morphologic changes: Secondary | ICD-10-CM

## 2019-09-07 DIAGNOSIS — R5383 Other fatigue: Secondary | ICD-10-CM

## 2019-09-07 DIAGNOSIS — F331 Major depressive disorder, recurrent, moderate: Secondary | ICD-10-CM

## 2019-09-14 ENCOUNTER — Other Ambulatory Visit: Payer: Self-pay

## 2019-09-14 ENCOUNTER — Ambulatory Visit (HOSPITAL_COMMUNITY)
Admission: RE | Admit: 2019-09-14 | Discharge: 2019-09-14 | Disposition: A | Discharge status: Home / Self Care | Payer: 59 | Source: Ambulatory Visit | Attending: Gerontology | Admitting: Gerontology

## 2019-09-14 DIAGNOSIS — F331 Major depressive disorder, recurrent, moderate: Secondary | ICD-10-CM | POA: Diagnosis present

## 2019-09-14 DIAGNOSIS — N049 Nephrotic syndrome with unspecified morphologic changes: Secondary | ICD-10-CM | POA: Diagnosis present

## 2019-09-14 DIAGNOSIS — R5383 Other fatigue: Secondary | ICD-10-CM | POA: Diagnosis present

## 2021-08-19 IMAGING — US US ABDOMEN COMPLETE
1 series · 14 of 25 positions shown · non-contrast
Comparison: None.

CLINICAL DATA: Elevated liver enzymes

EXAM:
ABDOMEN ULTRASOUND COMPLETE

[Series 1: us abdomen complete · 14 of 84 slices shown]
[im 1/84]
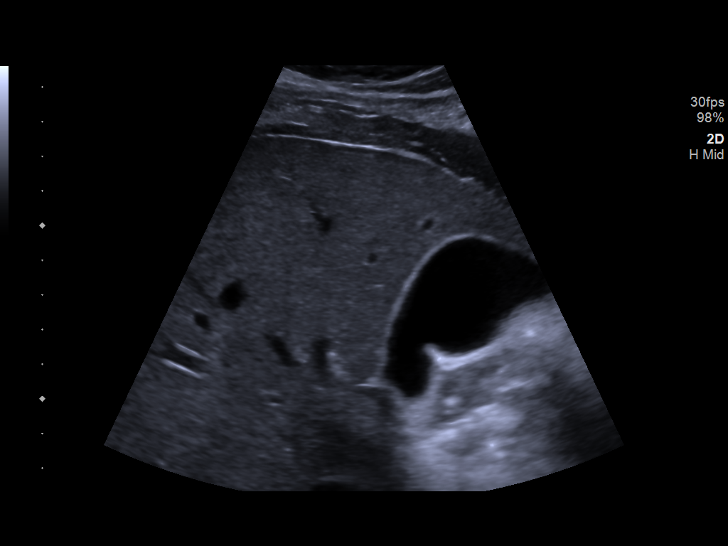
[im 7/84]
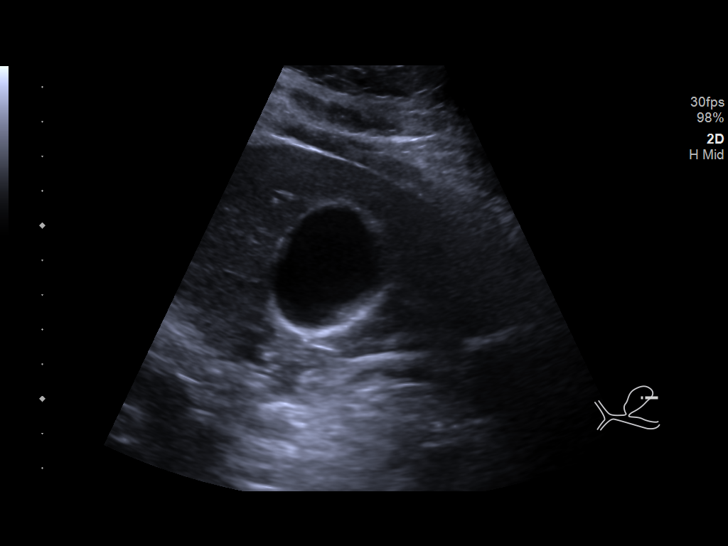
[im 14/84]
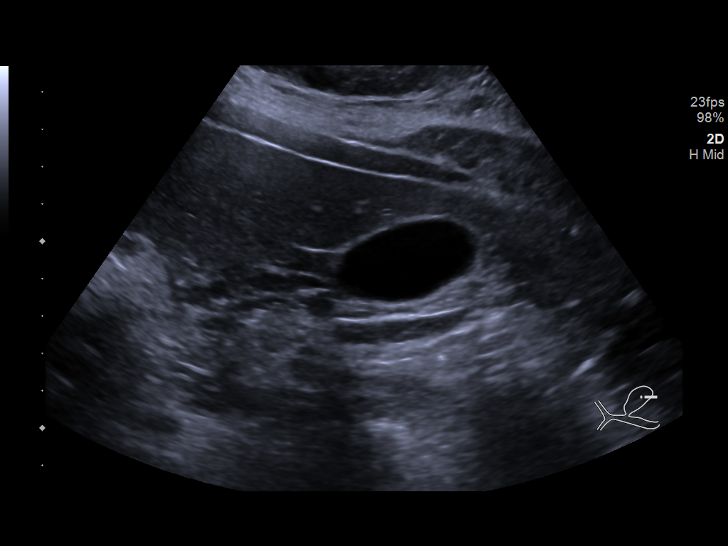
[im 21/84]
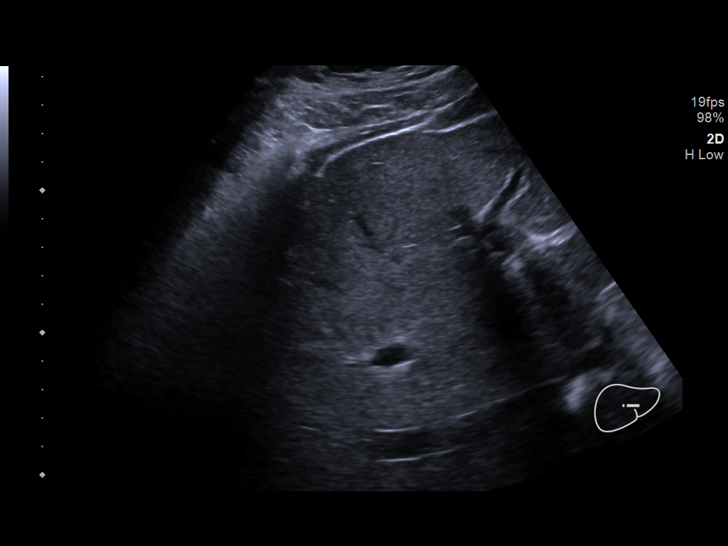
[im 28/84]
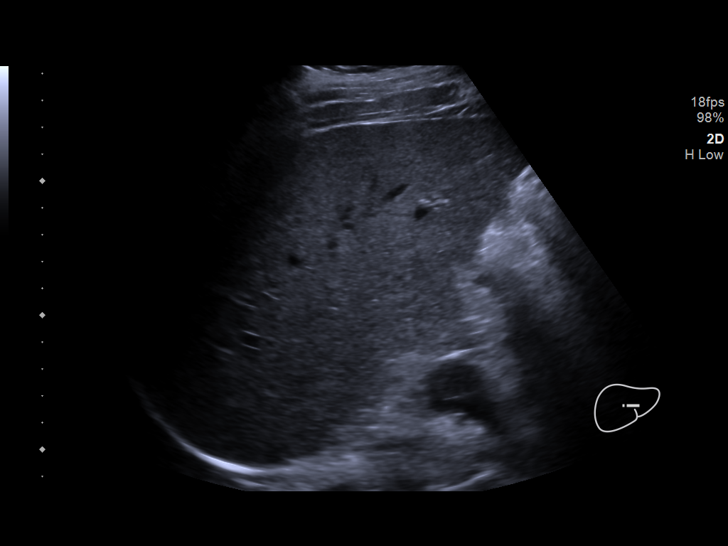
[im 32/84]
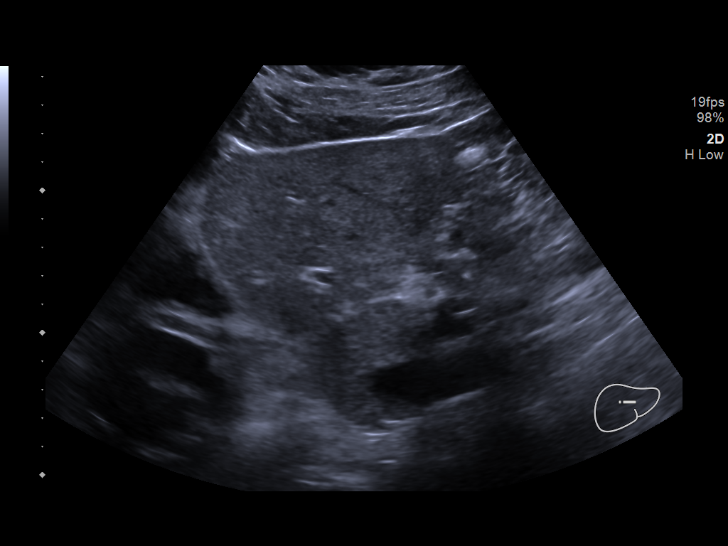
[im 39/84]
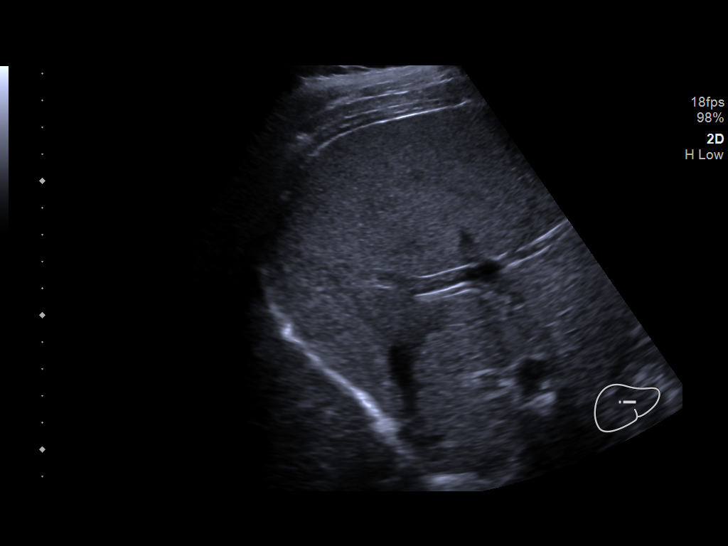
[im 45/84]
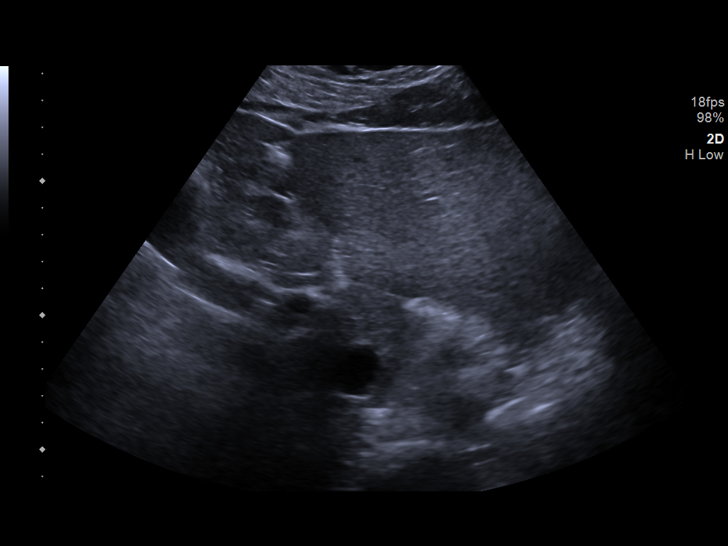
[im 52/84]
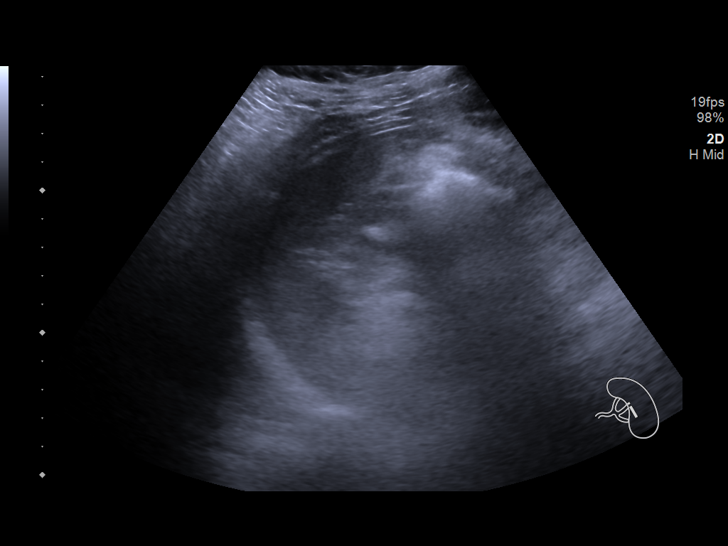
[im 56/84]
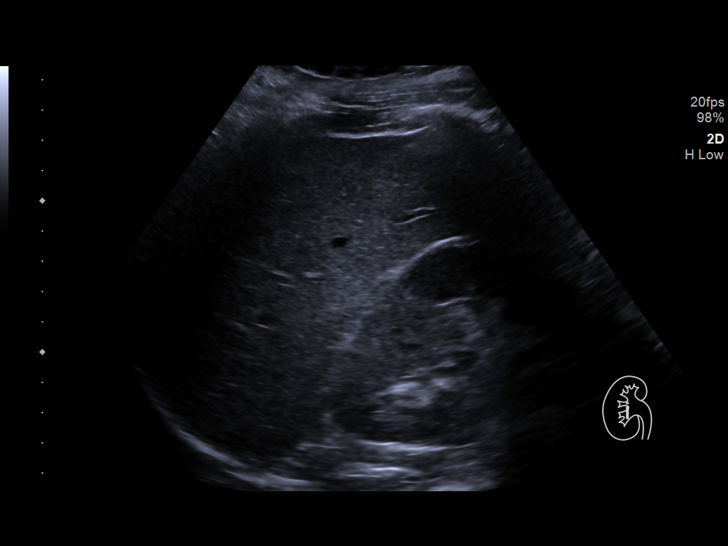
[im 63/84]
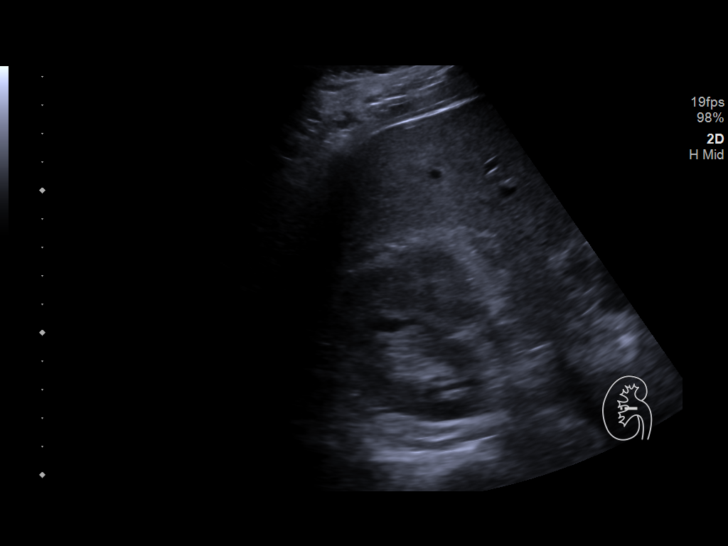
[im 70/84]
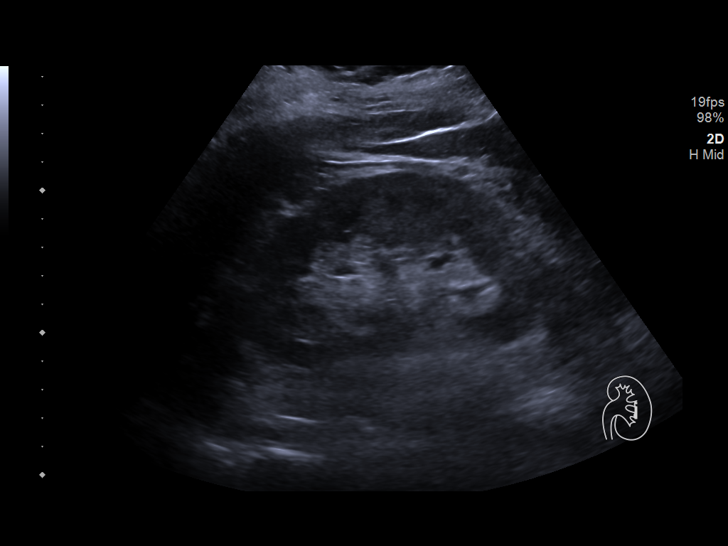
[im 77/84]
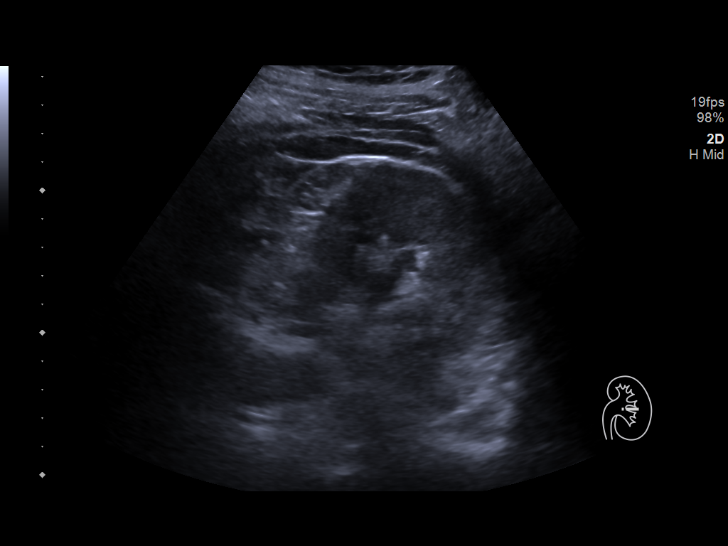
[im 84/84]
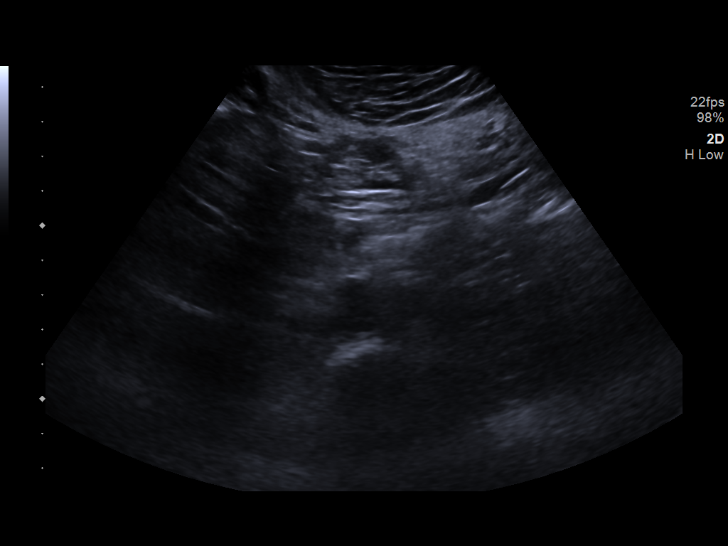

[14 of 25 positions shown; findings below may reference images not displayed]

FINDINGS: Gallbladder: No gallstones or wall thickening visualized. No
sonographic Murphy sign noted by sonographer.

Common bile duct: Diameter: 3 mm

Liver: No focal lesion identified. Within normal limits in
parenchymal echogenicity. Portal vein is patent on color Doppler
imaging with normal direction of blood flow towards the liver.

IVC: No abnormality visualized.

Pancreas: Visualized portion unremarkable.

Spleen: Size and appearance within normal limits.

Right Kidney: Length: 11 cm. Echogenicity within normal limits. No
mass or hydronephrosis visualized.

Left Kidney: Length: 10.7 cm. Echogenicity within normal limits. No
mass or hydronephrosis visualized.

Abdominal aorta: No aneurysm visualized.

Other findings: None.
IMPRESSION: Unremarkable ultrasound of the abdomen.

## 2022-08-02 ENCOUNTER — Encounter (HOSPITAL_BASED_OUTPATIENT_CLINIC_OR_DEPARTMENT_OTHER): Payer: Self-pay | Admitting: *Deleted

## 2022-08-02 ENCOUNTER — Other Ambulatory Visit: Payer: Self-pay

## 2022-08-02 ENCOUNTER — Emergency Department (HOSPITAL_BASED_OUTPATIENT_CLINIC_OR_DEPARTMENT_OTHER)
Admission: EM | Admit: 2022-08-02 | Discharge: 2022-08-03 | Disposition: A | Discharge status: Home / Self Care | Payer: 59 | Attending: Emergency Medicine | Admitting: Emergency Medicine

## 2022-08-02 DIAGNOSIS — T2020XA Burn of second degree of head, face, and neck, unspecified site, initial encounter: Secondary | ICD-10-CM | POA: Insufficient documentation

## 2022-08-02 DIAGNOSIS — I10 Essential (primary) hypertension: Secondary | ICD-10-CM | POA: Diagnosis not present

## 2022-08-02 DIAGNOSIS — Z7982 Long term (current) use of aspirin: Secondary | ICD-10-CM | POA: Diagnosis not present

## 2022-08-02 DIAGNOSIS — X063XXA Exposure to melting of other clothing and apparel, initial encounter: Secondary | ICD-10-CM | POA: Insufficient documentation

## 2022-08-02 DIAGNOSIS — Z79899 Other long term (current) drug therapy: Secondary | ICD-10-CM | POA: Insufficient documentation

## 2022-08-02 DIAGNOSIS — T22231A Burn of second degree of right upper arm, initial encounter: Secondary | ICD-10-CM | POA: Insufficient documentation

## 2022-08-02 DIAGNOSIS — Z23 Encounter for immunization: Secondary | ICD-10-CM | POA: Diagnosis not present

## 2022-08-02 DIAGNOSIS — T300 Burn of unspecified body region, unspecified degree: Secondary | ICD-10-CM

## 2022-08-02 MED ORDER — SILVER SULFADIAZINE 1 % EX CREA: 1.0000 | TOPICAL_CREAM | Freq: Every day | CUTANEOUS | 1 refills | Status: AC

## 2022-08-02 MED ORDER — TETANUS-DIPHTH-ACELL PERTUSSIS 5-2.5-18.5 LF-MCG/0.5 IM SUSY
0.5000 mL | PREFILLED_SYRINGE | Freq: Once | INTRAMUSCULAR | Status: AC
  Administered 2022-08-02: 0.5 mL via INTRAMUSCULAR
  Filled 2022-08-02: qty 0.5

## 2022-08-02 MED ORDER — SILVER SULFADIAZINE 1 % EX CREA
TOPICAL_CREAM | Freq: Once | CUTANEOUS | Status: AC
  Filled 2022-08-02: qty 85

## 2022-08-02 MED ORDER — OXYCODONE HCL 5 MG PO TABS: 2.5000 mg | ORAL_TABLET | Freq: Four times a day (QID) | ORAL | 0 refills | Status: AC | PRN

## 2022-08-02 MED ORDER — OXYCODONE-ACETAMINOPHEN 5-325 MG PO TABS
1.0000 | ORAL_TABLET | ORAL | Status: AC | PRN
  Administered 2022-08-02 (×2): 1 via ORAL
  Filled 2022-08-02 (×2): qty 1

## 2022-08-02 NOTE — Discharge Instructions (Addendum)
For pain control you may take at 1000 mg of Tylenol every 8 hours scheduled.  In addition you can take 0.5 to 1 tablet of Oxycodone every 6 hours as needed for pain not controlled with the scheduled Tylenol. ? ?

## 2022-08-02 NOTE — ED Notes (Signed)
Percocet given after discussion with the Provider. They were okay with giving a second percocet.

## 2022-08-02 NOTE — ED Notes (Signed)
Silvadene applied to right arm with non stick Telfa dressing and wrapped with keflex. Tolerated well. Bacitracin applied to the right side of pt's face. Voiced understanding of d/c instructions.
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
